# Patient Record
Sex: Male | Born: 1945 | Race: White | Hispanic: No | Marital: Married | Smoking: Current every day smoker
Health system: Southern US, Community
[De-identification: ages and names within clinical notes are randomized; demographics above are authoritative.]

## PROBLEM LIST (undated history)

## (undated) DIAGNOSIS — K259 Gastric ulcer, unspecified as acute or chronic, without hemorrhage or perforation: Secondary | ICD-10-CM

## (undated) DIAGNOSIS — K219 Gastro-esophageal reflux disease without esophagitis: Secondary | ICD-10-CM

## (undated) DIAGNOSIS — Z85828 Personal history of other malignant neoplasm of skin: Secondary | ICD-10-CM

## (undated) DIAGNOSIS — F32A Depression, unspecified: Secondary | ICD-10-CM

## (undated) DIAGNOSIS — H9190 Unspecified hearing loss, unspecified ear: Secondary | ICD-10-CM

## (undated) DIAGNOSIS — C801 Malignant (primary) neoplasm, unspecified: Secondary | ICD-10-CM

## (undated) DIAGNOSIS — F329 Major depressive disorder, single episode, unspecified: Secondary | ICD-10-CM

## (undated) DIAGNOSIS — D649 Anemia, unspecified: Secondary | ICD-10-CM

## (undated) HISTORY — DX: Gastric ulcer, unspecified as acute or chronic, without hemorrhage or perforation: K25.9

## (undated) HISTORY — PX: MANDIBLE SURGERY: SHX707

## (undated) HISTORY — PX: OTHER SURGICAL HISTORY: SHX169

## (undated) HISTORY — PX: PROSTATE BIOPSY: SHX241

## (undated) HISTORY — DX: Personal history of other malignant neoplasm of skin: Z85.828

---

## 1978-02-14 HISTORY — PX: SKIN GRAFT: SHX250

## 2007-05-20 ENCOUNTER — Emergency Department: Payer: Self-pay | Admitting: Emergency Medicine

## 2008-04-01 ENCOUNTER — Emergency Department: Payer: Self-pay | Admitting: Emergency Medicine

## 2012-12-12 DIAGNOSIS — Z85828 Personal history of other malignant neoplasm of skin: Secondary | ICD-10-CM

## 2012-12-12 HISTORY — DX: Personal history of other malignant neoplasm of skin: Z85.828

## 2013-03-15 ENCOUNTER — Ambulatory Visit: Payer: Self-pay | Admitting: Unknown Physician Specialty

## 2013-03-20 LAB — PATHOLOGY REPORT

## 2013-08-06 ENCOUNTER — Emergency Department: Payer: Self-pay | Admitting: Emergency Medicine

## 2013-08-06 LAB — CBC WITH DIFFERENTIAL/PLATELET
BASOS ABS: 0 10*3/uL (ref 0.0–0.1)
Basophil %: 0.1 %
EOS ABS: 0.2 10*3/uL (ref 0.0–0.7)
EOS PCT: 1 %
HCT: 44.8 % (ref 40.0–52.0)
HGB: 15 g/dL (ref 13.0–18.0)
LYMPHS ABS: 2.6 10*3/uL (ref 1.0–3.6)
LYMPHS PCT: 16.4 %
MCH: 34.4 pg — ABNORMAL HIGH (ref 26.0–34.0)
MCHC: 33.6 g/dL (ref 32.0–36.0)
MCV: 102 fL — AB (ref 80–100)
MONOS PCT: 5.7 %
Monocyte #: 0.9 x10 3/mm (ref 0.2–1.0)
NEUTROS ABS: 12.1 10*3/uL — AB (ref 1.4–6.5)
Neutrophil %: 76.8 %
PLATELETS: 193 10*3/uL (ref 150–440)
RBC: 4.38 10*6/uL — ABNORMAL LOW (ref 4.40–5.90)
RDW: 13.7 % (ref 11.5–14.5)
WBC: 15.8 10*3/uL — ABNORMAL HIGH (ref 3.8–10.6)

## 2013-08-06 LAB — COMPREHENSIVE METABOLIC PANEL
ALBUMIN: 3.1 g/dL — AB (ref 3.4–5.0)
ALK PHOS: 64 U/L
ALT: 41 U/L (ref 12–78)
AST: 13 U/L — AB (ref 15–37)
Anion Gap: 7 (ref 7–16)
BUN: 26 mg/dL — ABNORMAL HIGH (ref 7–18)
Bilirubin,Total: 0.3 mg/dL (ref 0.2–1.0)
CO2: 30 mmol/L (ref 21–32)
CREATININE: 0.64 mg/dL (ref 0.60–1.30)
Calcium, Total: 9.1 mg/dL (ref 8.5–10.1)
Chloride: 102 mmol/L (ref 98–107)
EGFR (African American): >60
EGFR (Non-African Amer.): >60
GLUCOSE: 148 mg/dL — AB (ref 65–99)
Osmolality: 285 (ref 275–301)
Potassium: 4.2 mmol/L (ref 3.5–5.1)
Sodium: 139 mmol/L (ref 136–145)
Total Protein: 5.9 g/dL — ABNORMAL LOW (ref 6.4–8.2)

## 2013-08-06 LAB — TROPONIN I: Troponin-I: <0.02 ng/mL

## 2015-02-02 DIAGNOSIS — H259 Unspecified age-related cataract: Secondary | ICD-10-CM | POA: Diagnosis not present

## 2015-02-02 DIAGNOSIS — H9190 Unspecified hearing loss, unspecified ear: Secondary | ICD-10-CM | POA: Diagnosis not present

## 2015-02-02 DIAGNOSIS — Z0000 Encounter for general adult medical examination without abnormal findings: Secondary | ICD-10-CM | POA: Diagnosis not present

## 2015-02-02 DIAGNOSIS — Z6825 Body mass index (BMI) 25.0-25.9, adult: Secondary | ICD-10-CM | POA: Diagnosis not present

## 2015-03-25 DIAGNOSIS — Z0000 Encounter for general adult medical examination without abnormal findings: Secondary | ICD-10-CM | POA: Diagnosis not present

## 2015-03-25 DIAGNOSIS — Z86010 Personal history of colonic polyps: Secondary | ICD-10-CM | POA: Diagnosis not present

## 2015-03-25 DIAGNOSIS — Z125 Encounter for screening for malignant neoplasm of prostate: Secondary | ICD-10-CM | POA: Diagnosis not present

## 2015-03-25 DIAGNOSIS — Z23 Encounter for immunization: Secondary | ICD-10-CM | POA: Diagnosis not present

## 2015-03-25 DIAGNOSIS — D531 Other megaloblastic anemias, not elsewhere classified: Secondary | ICD-10-CM | POA: Diagnosis not present

## 2015-03-25 DIAGNOSIS — H538 Other visual disturbances: Secondary | ICD-10-CM | POA: Diagnosis not present

## 2015-03-25 DIAGNOSIS — M25512 Pain in left shoulder: Secondary | ICD-10-CM | POA: Diagnosis not present

## 2015-03-27 DIAGNOSIS — E538 Deficiency of other specified B group vitamins: Secondary | ICD-10-CM | POA: Diagnosis not present

## 2015-03-31 DIAGNOSIS — H2513 Age-related nuclear cataract, bilateral: Secondary | ICD-10-CM | POA: Diagnosis not present

## 2015-04-03 ENCOUNTER — Encounter: Payer: Self-pay | Admitting: Internal Medicine

## 2015-04-13 ENCOUNTER — Ambulatory Visit (INDEPENDENT_AMBULATORY_CARE_PROVIDER_SITE_OTHER): Payer: Commercial Managed Care - HMO | Admitting: Urology

## 2015-04-13 ENCOUNTER — Encounter: Payer: Self-pay | Admitting: Urology

## 2015-04-13 VITALS — BP 172/77 | HR 65 | Ht 63.75 in | Wt 128.4 lb

## 2015-04-13 DIAGNOSIS — R972 Elevated prostate specific antigen [PSA]: Secondary | ICD-10-CM | POA: Diagnosis not present

## 2015-04-13 NOTE — Progress Notes (Signed)
Consultation: Elevated PSA Requested by: Dr. Candiss Norse  History of present illness: 70 year old white male was recently seen to establish primary care. The PSA was checked and noted to be 7.48. No prior history PSA screening. No family history of prostate cancer. He has some occasional frequency and urgency but he voids with a good stream. No gross hematuria or dysuria. No history of BPH but he does have issues with getting and maintaining an erection.  His past medical history, surgical history, family history, social history, medications and allergies were reviewed. Significant findings noted.  On review of systems, 13 system review of systems was obtained which was negative except for the following: Constipation, blurry vision, double vision, easy bruising, cough, joint pain  Physical exam: Patient was in no acute distress On urologic exam the penis was circumcised and without mass or lesion. The testicles were descended bilaterally and palpably normal. No inguinal hernias were palpated. On digital rectal exam the prostate is small but felt quite indurated with the right side greater than the left side. I didn't note any specific mass or nodule.  Assessment/plan: Elevated PSA-I had a long discussion with the patient and his wife about the nature of elevated PSA and the nature risk and benefits of PSA screening. We discussed the nature risks and benefits of continued surveillance or a prostate biopsy. Although he had no specific mass on the prostate he did feel quite indurated and I recommended a biopsy. We also discussed other lab tests and I sent a repeat PSA with a reflex to free. We discussed prostate cancer management might include surveillance versus treatment depending on patient and cancer characteristics in the treatment usually consist of surgery or some form of radiation. I went over the anatomy with the patient and his wife. All questions answered. Repeat blood work sent and he'll follow-up  for prostate biopsy.

## 2015-04-14 ENCOUNTER — Telehealth: Payer: Self-pay

## 2015-04-14 LAB — PSA, TOTAL AND FREE
PSA, Free Pct: 6.5 %
PSA, Free: 0.51 ng/mL
Prostate Specific Ag, Serum: 7.9 ng/mL — ABNORMAL HIGH (ref 0.0–4.0)

## 2015-04-14 NOTE — Telephone Encounter (Signed)
Spoke with pt in reference to PSA and prostate bx. Pt voiced understanding.  

## 2015-04-14 NOTE — Telephone Encounter (Signed)
-----   Message from Festus Aloe, MD sent at 04/14/2015 10:47 AM EST ----- Notify patient his PSA remains elevated and % free PSA is low (both risks for prostate cancer), therefore keep plan for prostate biopsy.   ----- Message -----    From: Lestine Box, LPN    Sent: [id number]   8:10 AM      To: Festus Aloe, MD    ----- Message -----    From: [facility] Lab Results In Interface    Sent: 04/14/2015   7:43 AM      To: Rowe Robert Clinical

## 2015-04-16 ENCOUNTER — Telehealth: Payer: Self-pay

## 2015-04-16 NOTE — Telephone Encounter (Signed)
Spoke with pt in reference to PSA and prostate bx. Pt voiced understanding.  

## 2015-04-16 NOTE — Telephone Encounter (Signed)
-----   Message from Festus Aloe, MD sent at 04/15/2015  3:31 PM EST ----- PSA remains elevated. Notify patient. Keep plan for prostate biopsy.   ----- Message -----    From: Lestine Box, LPN    Sent: [id number]   1:20 PM      To: Festus Aloe, MD    ----- Message -----    From: [facility] Lab Results In Interface    Sent: 04/14/2015   7:43 AM      To: Rowe Robert Clinical

## 2015-05-18 ENCOUNTER — Ambulatory Visit (INDEPENDENT_AMBULATORY_CARE_PROVIDER_SITE_OTHER): Payer: Commercial Managed Care - HMO | Admitting: Urology

## 2015-05-18 ENCOUNTER — Other Ambulatory Visit: Payer: Self-pay | Admitting: Urology

## 2015-05-18 ENCOUNTER — Encounter: Payer: Self-pay | Admitting: Urology

## 2015-05-18 VITALS — BP 117/71 | HR 80 | Ht 63.75 in | Wt 127.9 lb

## 2015-05-18 DIAGNOSIS — R972 Elevated prostate specific antigen [PSA]: Secondary | ICD-10-CM

## 2015-05-18 DIAGNOSIS — C61 Malignant neoplasm of prostate: Secondary | ICD-10-CM | POA: Diagnosis not present

## 2015-05-18 MED ORDER — LIDOCAINE HCL 2 % EX GEL
1.0000 "application " | Freq: Once | CUTANEOUS | Status: AC
  Administered 2015-05-18: 1 via URETHRAL

## 2015-05-18 MED ORDER — GENTAMICIN SULFATE 40 MG/ML IJ SOLN
80.0000 mg | Freq: Once | INTRAMUSCULAR | Status: AC
  Administered 2015-05-18: 80 mg via INTRAMUSCULAR

## 2015-05-18 MED ORDER — LEVOFLOXACIN 500 MG PO TABS
500.0000 mg | ORAL_TABLET | Freq: Once | ORAL | Status: AC
  Administered 2015-05-18: 500 mg via ORAL

## 2015-05-18 NOTE — Progress Notes (Signed)
F/u - Recent PSA noted to be 7.48. Right side prostate indurated. No family history of prostate cancer. He has some occasional frequency and urgency but he voids with a good stream. No history of BPH but he does have issues with getting and maintaining an erection.  His PSA was repeated 04/13/2015 and was 7.9 with a 6.5% free. He presents today for prostate biopsy and his been well without dysuria or gross hematuria.  Procedure: Prostate ultrasound-patient placed in left lateral decubitus position. Digital rectal exam was performed. Ultrasound probe was introduced per rectum and transrectal ultrasound of the prostate obtained. Ultrasound needle guidance was then used to obtain standard 12 core biopsy, base, mid, apex, lateral to medial, left and right.  Findings: On digital rectal exam the prostate at some subtle induration of the right side but all landmarks preserved. No specific nodules. Ultrasound of the prostate was normal, the prostate measured 4.09 cm in length, 2.24 cm in width, 4.4 cm in height for a volume of 21.44 g.  Assessment/plan- T1c/T2 1, prostate 21 g-tolerated biopsy well. Will notify of result and have him return for discussion.

## 2015-05-19 DIAGNOSIS — F17200 Nicotine dependence, unspecified, uncomplicated: Secondary | ICD-10-CM | POA: Insufficient documentation

## 2015-05-19 DIAGNOSIS — K2990 Gastroduodenitis, unspecified, without bleeding: Secondary | ICD-10-CM | POA: Diagnosis not present

## 2015-05-19 DIAGNOSIS — M25519 Pain in unspecified shoulder: Secondary | ICD-10-CM | POA: Insufficient documentation

## 2015-05-19 DIAGNOSIS — G8929 Other chronic pain: Secondary | ICD-10-CM | POA: Diagnosis not present

## 2015-05-19 DIAGNOSIS — E538 Deficiency of other specified B group vitamins: Secondary | ICD-10-CM | POA: Insufficient documentation

## 2015-05-19 DIAGNOSIS — K2970 Gastritis, unspecified, without bleeding: Secondary | ICD-10-CM | POA: Diagnosis not present

## 2015-05-19 DIAGNOSIS — M25512 Pain in left shoulder: Secondary | ICD-10-CM | POA: Diagnosis not present

## 2015-05-23 LAB — PATHOLOGY REPORT

## 2015-06-01 ENCOUNTER — Ambulatory Visit: Payer: Commercial Managed Care - HMO

## 2015-06-04 ENCOUNTER — Other Ambulatory Visit: Payer: Self-pay | Admitting: Urology

## 2015-06-05 ENCOUNTER — Encounter: Payer: Self-pay | Admitting: *Deleted

## 2015-06-05 ENCOUNTER — Ambulatory Visit (INDEPENDENT_AMBULATORY_CARE_PROVIDER_SITE_OTHER): Payer: Commercial Managed Care - HMO | Admitting: Urology

## 2015-06-05 DIAGNOSIS — C61 Malignant neoplasm of prostate: Secondary | ICD-10-CM

## 2015-06-05 NOTE — Progress Notes (Signed)
06/05/2015 12:22 PM   Rinaldo Cloud 1945-11-28 [id number]  Referring provider: Glendon Axe, MD [place] [facility] [place], [place] [address]  Chief Complaint  Patient presents with  . Follow-up    biopsy results    HPI: 70 year old white male was recently seen to establish primary care. The PSA was checked and noted to be 7.48. No prior history PSA screening. No family history of prostate cancer. He has some occasional frequency and urgency but he voids with a good stream. No gross hematuria or dysuria. No history of BPH but he does have issues with getting and maintaining an erection.  The patient underwent a prostate biopsy which was positive for of 12 cores for Gleason 3+3 = 6 prostate cancer. Volumes of the cores ranged from 27% to 85%.    PMH: Past Medical History  Diagnosis Date  . H/O malignant neoplasm of skin 12/12/2012  . Stomach ulcer     Surgical History: Past Surgical History  Procedure Laterality Date  . Skin cancer removal    . Skin graft  1980    Home Medications:    Medication List       This list is accurate as of: 06/05/15 12:22 PM.  Always use your most recent med list.               cyanocobalamin 1000 MCG/ML injection  Commonly known as:  (VITAMIN B-12)  Inject into the muscle.     pantoprazole 40 MG tablet  Commonly known as:  PROTONIX  Take by mouth. Reported on 06/05/2015     polyethylene glycol powder powder  Commonly known as:  GLYCOLAX/MIRALAX  Reported on 05/18/2015     polyethylene glycol powder powder  Commonly known as:  GLYCOLAX/MIRALAX  Reported on 06/05/2015     RA ACETAMINOPHEN 650 MG CR tablet  Generic drug:  acetaminophen  Take by mouth.     traMADol 50 MG tablet  Commonly known as:  ULTRAM        Allergies: No Known Allergies  Family History: No family history on file.  Social History:  reports that he has been smoking Cigarettes.  He has been smoking about 2.00 packs per day. He does  not have any smokeless tobacco history on file. He reports that he drinks alcohol. He reports that he does not use illicit drugs.  ROS:                                        Physical Exam: There were no vitals taken for this visit.  Constitutional:  Alert and oriented, No acute distress. HEENT: [place] AT, moist mucus membranes.  Trachea midline, no masses. Cardiovascular: No clubbing, cyanosis, or edema. Respiratory: Normal respiratory effort, no increased work of breathing. GI: Abdomen is soft, nontender, nondistended, no abdominal masses GU: No CVA tenderness.  Skin: No rashes, bruises or suspicious lesions. Lymph: No cervical or inguinal adenopathy. Neurologic: Grossly intact, no focal deficits, moving all 4 extremities. Psychiatric: Normal mood and affect.  Laboratory Data: Lab Results  Component Value Date   WBC 15.8* 08/06/2013   HGB 15.0 08/06/2013   HCT 44.8 08/06/2013   MCV 102* 08/06/2013   PLT 193 08/06/2013    Lab Results  Component Value Date   CREATININE 0.64 08/06/2013    No results found for: PSA  No results found for: TESTOSTERONE  No results  found for: HGBA1C  Urinalysis No results found for: COLORURINE, APPEARANCEUR, LABSPEC, [place], GLUCOSEU, HGBUR, BILIRUBINUR, KETONESUR, PROTEINUR, UROBILINOGEN, NITRITE, LEUKOCYTESUR   Assessment & Plan:   I had a long discussion with the patient regarding his new diagnosis of low risk prostate cancer. We discussed the natural course of prostate cancer. We also discussed Gleason score grades and how he falls into the low risk category. We discussed treatment modalities which include watchful waiting, active surveillance, robotic prostatectomy, radiation therapy, and cryotherapy. In particular we discussed active surveillance in great detail. He understands the goal of active surveillance is to monitor his prostate cancer and if and when he progresses to still treat the prostate cancer with a  goal for cure. He understands the risks and benefits of this treatment approach. He does understand the risks including but limited to missing the curative window for his prostate cancer as well as missing an opportunity for nursery. He understands the benefit is not to suffer the side effects of prostate cancer treatment for the treatment of a cancer currently is not life-threatening. He understands he will need close follow-up with a repeat prostate biopsy in 3 months. He also understands. DRE and PSA every 6 months after that with more repeat biopsies down the road. He would like to pursue active surveillance. He would like to wait until 4 months ago so he can do this summer at his [place].   1. Low risk prostate cancer -repeat prostate biopsy in 4 months  No Follow-up on file.  Nickie Retort, MD  [facility] [address], [place], [place] [address] [phone]

## 2015-06-08 ENCOUNTER — Ambulatory Visit: Payer: Commercial Managed Care - HMO | Admitting: Anesthesiology

## 2015-06-08 ENCOUNTER — Encounter: Payer: Self-pay | Admitting: *Deleted

## 2015-06-08 ENCOUNTER — Ambulatory Visit
Admission: RE | Admit: 2015-06-08 | Discharge: 2015-06-08 | Disposition: A | Discharge status: Home / Self Care | Payer: Commercial Managed Care - HMO | Source: Ambulatory Visit | Attending: Unknown Physician Specialty | Admitting: Unknown Physician Specialty

## 2015-06-08 ENCOUNTER — Encounter
Admission: RE | Disposition: A | Discharge status: Home / Self Care | Payer: Self-pay | Source: Ambulatory Visit | Attending: Unknown Physician Specialty

## 2015-06-08 DIAGNOSIS — D127 Benign neoplasm of rectosigmoid junction: Secondary | ICD-10-CM | POA: Insufficient documentation

## 2015-06-08 DIAGNOSIS — K648 Other hemorrhoids: Secondary | ICD-10-CM | POA: Diagnosis not present

## 2015-06-08 DIAGNOSIS — R972 Elevated prostate specific antigen [PSA]: Secondary | ICD-10-CM | POA: Diagnosis not present

## 2015-06-08 DIAGNOSIS — Z86010 Personal history of colonic polyps: Secondary | ICD-10-CM | POA: Diagnosis not present

## 2015-06-08 DIAGNOSIS — D123 Benign neoplasm of transverse colon: Secondary | ICD-10-CM | POA: Diagnosis not present

## 2015-06-08 DIAGNOSIS — K640 First degree hemorrhoids: Secondary | ICD-10-CM | POA: Diagnosis not present

## 2015-06-08 DIAGNOSIS — F17210 Nicotine dependence, cigarettes, uncomplicated: Secondary | ICD-10-CM | POA: Insufficient documentation

## 2015-06-08 DIAGNOSIS — Z85828 Personal history of other malignant neoplasm of skin: Secondary | ICD-10-CM | POA: Insufficient documentation

## 2015-06-08 DIAGNOSIS — K635 Polyp of colon: Secondary | ICD-10-CM | POA: Diagnosis not present

## 2015-06-08 DIAGNOSIS — D124 Benign neoplasm of descending colon: Secondary | ICD-10-CM | POA: Insufficient documentation

## 2015-06-08 DIAGNOSIS — Z79899 Other long term (current) drug therapy: Secondary | ICD-10-CM | POA: Diagnosis not present

## 2015-06-08 DIAGNOSIS — D122 Benign neoplasm of ascending colon: Secondary | ICD-10-CM | POA: Insufficient documentation

## 2015-06-08 DIAGNOSIS — K279 Peptic ulcer, site unspecified, unspecified as acute or chronic, without hemorrhage or perforation: Secondary | ICD-10-CM | POA: Diagnosis not present

## 2015-06-08 DIAGNOSIS — Z1211 Encounter for screening for malignant neoplasm of colon: Secondary | ICD-10-CM | POA: Diagnosis not present

## 2015-06-08 HISTORY — PX: COLONOSCOPY WITH PROPOFOL: SHX5780

## 2015-06-08 HISTORY — DX: Malignant (primary) neoplasm, unspecified: C80.1

## 2015-06-08 SURGERY — COLONOSCOPY WITH PROPOFOL
Anesthesia: General

## 2015-06-08 MED ORDER — SODIUM CHLORIDE 0.9 % IJ SOLN: INTRAMUSCULAR | Status: DC | PRN

## 2015-06-08 MED ORDER — FENTANYL CITRATE (PF) 100 MCG/2ML IJ SOLN
INTRAMUSCULAR | Status: DC | PRN
Start: 2015-06-08 — End: 2015-06-08
  Administered 2015-06-08: 50 ug via INTRAVENOUS

## 2015-06-08 MED ORDER — EPHEDRINE SULFATE 50 MG/ML IJ SOLN
INTRAMUSCULAR | Status: DC | PRN
  Administered 2015-06-08: 10 mg via INTRAVENOUS
  Administered 2015-06-08: 5 mg via INTRAVENOUS
  Administered 2015-06-08: 10 mg via INTRAVENOUS

## 2015-06-08 MED ORDER — SODIUM CHLORIDE 0.9 % IV SOLN
INTRAVENOUS | Status: DC
  Administered 2015-06-08 (×2): via INTRAVENOUS

## 2015-06-08 MED ORDER — MIDAZOLAM HCL 2 MG/2ML IJ SOLN
INTRAMUSCULAR | Status: DC | PRN
  Administered 2015-06-08: 1 mg via INTRAVENOUS

## 2015-06-08 MED ORDER — PROPOFOL 500 MG/50ML IV EMUL
INTRAVENOUS | Status: DC | PRN
  Administered 2015-06-08: 120 ug/kg/min via INTRAVENOUS

## 2015-06-08 NOTE — Op Note (Signed)
[facility] Gastroenterology Patient Name: Eugene Bennett Procedure Date: 06/08/2015 4:08 PM MRN: [id number] Account #: [id number] Date of Birth: Nov 24, 1945 Admit Type: Outpatient Age: 70 Room: [facility] ENDO ROOM 1 Gender: Male Note Status: Finalized Procedure:            Colonoscopy Indications:          High risk colon cancer surveillance: Personal history                        of colonic polyps Providers:            Manya Silvas, MD Referring MD:         Glendon Axe (Referring MD) Medicines:            Propofol per Anesthesia Complications:        No immediate complications. Procedure:            Pre-Anesthesia Assessment:                       - After reviewing the risks and benefits, the patient                        was deemed in satisfactory condition to undergo the                        procedure.                       After obtaining informed consent, the colonoscope was                        passed under direct vision. Throughout the procedure,                        the patient's blood pressure, pulse, and oxygen                        saturations were monitored continuously. The                        Colonoscope was introduced through the anus and                        advanced to the the cecum, identified by appendiceal                        orifice and ileocecal valve. The colonoscopy was                        somewhat difficult. The patient tolerated the procedure                        well. The quality of the bowel preparation was adequate                        to identify polyps. Findings:      Four sessile polyps were found in the ascending colon. The polyps were       small in size. These polyps were removed with a hot snare. Resection and       retrieval were complete.      A 19 mm  polyp was found in the ascending colon. The polyp was sessile.       The polyp was removed with a hot snare. Resection and retrieval were       complete.  To prevent bleeding after the polypectomy, four hemostatic       clips were successfully placed. There was no bleeding during, or at the       end, of the procedure.      Two sessile polyps were found in the descending colon. The polyps were       small in size. These polyps were removed with a hot snare. Resection and       retrieval were complete.      Two sessile polyps were found in the recto-sigmoid colon. The polyps       were small in size. These polyps were removed with a hot snare.       Resection and retrieval were complete. To prevent bleeding after the       polypectomy, one hemostatic clip was successfully placed. There was no       bleeding during, or at the end, of the procedure.      Internal hemorrhoids were found during endoscopy. The hemorrhoids were       small and Grade I (internal hemorrhoids that do not prolapse). Impression:           - Four small polyps in the ascending colon, removed                        with a hot snare. Resected and retrieved.                       - One 19 mm polyp in the ascending colon, removed with                        a hot snare. Resected and retrieved. Clips were placed.                       - Two small polyps in the descending colon, removed                        with a hot snare. Resected and retrieved.                       - Two small polyps at the recto-sigmoid colon, removed                        with a hot snare. Resected and retrieved. Clip was                        placed.                       - Internal hemorrhoids. Recommendation:       - Await pathology results. DO NOT TAKE ANY ADVIL,                        ALEVE, IBUPROFEN OR FULL STRENGTH ASPRIN. Manya Silvas, MD 06/08/2015 5:25:54 PM This report has been signed electronically. Number of Addenda: 0 Note Initiated On: 06/08/2015 4:08 PM Scope Withdrawal Time: 0 hours 54 minutes 47 seconds  Total Procedure Duration: 1 hour  5 minutes 57 seconds       [facility]

## 2015-06-08 NOTE — Transfer of Care (Signed)
Immediate Anesthesia Transfer of Care Note  Patient: Eugene Bennett  Procedure(s) Performed: Procedure(s): COLONOSCOPY WITH PROPOFOL (N/A)  Patient Location: PACU  Anesthesia Type:General  Level of Consciousness: awake, alert , oriented and sedated  Airway & Oxygen Therapy: Patient Spontanous Breathing and Patient connected to nasal cannula oxygen  Post-op Assessment: Report given to RN and Post -op Vital signs reviewed and stable  Post vital signs: Reviewed and stable  Last Vitals:  Filed Vitals:   06/08/15 1459 06/08/15 1720  BP: 132/61 122/54  Pulse: 62 86  Temp: 36.6 C 36 C  Resp: 18 15    Complications: No apparent anesthesia complications

## 2015-06-08 NOTE — Anesthesia Preprocedure Evaluation (Signed)
Anesthesia Evaluation  Patient identified by MRN, date of birth, ID band Patient awake    Reviewed: Allergy & Precautions, NPO status , Patient's Chart, lab work & pertinent test results, reviewed documented beta blocker date and time   Airway Mallampati: II  TM Distance: >3 FB     Dental  (+) Chipped   Pulmonary Current Smoker,           Cardiovascular      Neuro/Psych    GI/Hepatic PUD,   Endo/Other    Renal/GU      Musculoskeletal   Abdominal   Peds  Hematology   Anesthesia Other Findings   Reproductive/Obstetrics                             Anesthesia Physical Anesthesia Plan  ASA: III  Anesthesia Plan: General   Post-op Pain Management:    Induction: Intravenous  Airway Management Planned: Nasal Cannula  Additional Equipment:   Intra-op Plan:   Post-operative Plan:   Informed Consent: I have reviewed the patients History and Physical, chart, labs and discussed the procedure including the risks, benefits and alternatives for the proposed anesthesia with the patient or authorized representative who has indicated his/her understanding and acceptance.     Plan Discussed with: CRNA  Anesthesia Plan Comments:         Anesthesia Quick Evaluation

## 2015-06-08 NOTE — H&P (Signed)
   Primary Care Physician:  Glendon Axe, MD Primary Gastroenterologist:  Dr. Vira Agar  Pre-Procedure History & Physical: HPI:  Eugene Bennett is a 70 y.o. male is here for an colonoscopy.   Past Medical History  Diagnosis Date  . H/O malignant neoplasm of skin 12/12/2012  . Stomach ulcer   . Cancer [facility])     Past Surgical History  Procedure Laterality Date  . Skin cancer removal    . Skin graft  1980    Prior to Admission medications   Medication Sig Start Date End Date Taking? Authorizing Provider  cyanocobalamin (,VITAMIN B-12,) 1000 MCG/ML injection Inject into the muscle. 03/27/15  Yes Historical Provider, MD  pantoprazole (PROTONIX) 40 MG tablet Take by mouth. Reported on 06/05/2015 05/19/15 05/18/16 Yes Historical Provider, MD  polyethylene glycol powder (GLYCOLAX/MIRALAX) powder Reported on 05/18/2015 04/14/15  Yes Historical Provider, MD  polyethylene glycol powder (GLYCOLAX/MIRALAX) powder Reported on 06/05/2015 04/21/15  Yes Historical Provider, MD  traMADol Veatrice Bourbon) 50 MG tablet  05/19/15  Yes Historical Provider, MD  acetaminophen (RA ACETAMINOPHEN) 650 MG CR tablet Take by mouth. Reported on 06/08/2015    Historical Provider, MD    Allergies as of 06/02/2015  . (No Known Allergies)    History reviewed. No pertinent family history.  Social History   Social History  . Marital Status: Married    Spouse Name: N/A  . Number of Children: N/A  . Years of Education: N/A   Occupational History  . Not on file.   Social History Main Topics  . Smoking status: Current Some Day Smoker -- 2.00 packs/day    Types: Cigarettes  . Smokeless tobacco: Not on file  . Alcohol Use: 0.0 oz/week    0 Standard drinks or equivalent per week  . Drug Use: No  . Sexual Activity: Not on file   Other Topics Concern  . Not on file   Social History Narrative    Review of Systems: See HPI, otherwise negative ROS  Physical Exam: BP 132/61 mmHg  Pulse 62  Temp(Src) 97.8 F (36.6 C)  (Oral)  Resp 18  Ht 5\' 3"  (1.6 m)  Wt 58.514 kg (129 lb)  BMI 22.86 kg/m2  SpO2 100% General:   Alert,  pleasant and cooperative in NAD Head:  Normocephalic and atraumatic. Neck:  Supple; no masses or thyromegaly. Lungs:  Clear throughout to auscultation.    Heart:  Regular rate and rhythm. Abdomen:  Soft, nontender and nondistended. Normal bowel sounds, without guarding, and without rebound.   Neurologic:  Alert and  oriented x4;  grossly normal neurologically.  Impression/Plan: KHALYL CASABLANCA is here for an colonoscopy to be performed for [facility] colon polyps  Risks, benefits, limitations, and alternatives regarding  colonoscopy have been reviewed with the patient.  Questions have been answered.  All parties agreeable.   Gaylyn Cheers, MD  06/08/2015, 4:05 PM

## 2015-06-08 NOTE — Anesthesia Procedure Notes (Signed)
Performed by: COOK-MARTIN, Saraya Tirey Pre-anesthesia Checklist: Patient identified, Emergency Drugs available, Suction available, Patient being monitored and Timeout performed Patient Re-evaluated:Patient Re-evaluated prior to inductionOxygen Delivery Method: Simple face mask Preoxygenation: Pre-oxygenation with 100% oxygen Intubation Type: IV induction Placement Confirmation: positive ETCO2 and CO2 detector       

## 2015-06-08 NOTE — Anesthesia Postprocedure Evaluation (Signed)
Anesthesia Post Note  Patient: Eugene Bennett  Procedure(s) Performed: Procedure(s) (LRB): COLONOSCOPY WITH PROPOFOL (N/A)  Patient location during evaluation: Endoscopy Anesthesia Type: General Level of consciousness: awake and alert Pain management: pain level controlled Vital Signs Assessment: post-procedure vital signs reviewed and stable Respiratory status: spontaneous breathing, nonlabored ventilation, respiratory function stable and patient connected to nasal cannula oxygen Cardiovascular status: blood pressure returned to baseline and stable Postop Assessment: no signs of nausea or vomiting Anesthetic complications: no    Last Vitals:  Filed Vitals:   06/08/15 1742 06/08/15 1750  BP: 120/81 135/67  Pulse: 72 72  Temp:    Resp: 19 21    Last Pain: There were no vitals filed for this visit.               [name],Arlinda Barcelona S

## 2015-06-10 ENCOUNTER — Encounter: Payer: Self-pay | Admitting: Unknown Physician Specialty

## 2015-06-10 LAB — SURGICAL PATHOLOGY

## 2015-08-27 DIAGNOSIS — G8929 Other chronic pain: Secondary | ICD-10-CM | POA: Diagnosis not present

## 2015-08-27 DIAGNOSIS — C61 Malignant neoplasm of prostate: Secondary | ICD-10-CM | POA: Diagnosis not present

## 2015-08-27 DIAGNOSIS — K2970 Gastritis, unspecified, without bleeding: Secondary | ICD-10-CM | POA: Diagnosis not present

## 2015-08-27 DIAGNOSIS — F17200 Nicotine dependence, unspecified, uncomplicated: Secondary | ICD-10-CM | POA: Diagnosis not present

## 2015-08-27 DIAGNOSIS — K2990 Gastroduodenitis, unspecified, without bleeding: Secondary | ICD-10-CM | POA: Diagnosis not present

## 2015-08-27 DIAGNOSIS — M25512 Pain in left shoulder: Secondary | ICD-10-CM | POA: Diagnosis not present

## 2015-08-27 DIAGNOSIS — E538 Deficiency of other specified B group vitamins: Secondary | ICD-10-CM | POA: Diagnosis not present

## 2015-10-22 ENCOUNTER — Ambulatory Visit (INDEPENDENT_AMBULATORY_CARE_PROVIDER_SITE_OTHER): Payer: Commercial Managed Care - HMO | Admitting: Urology

## 2015-10-22 ENCOUNTER — Encounter: Payer: Self-pay | Admitting: Urology

## 2015-10-22 ENCOUNTER — Other Ambulatory Visit: Payer: Self-pay | Admitting: Urology

## 2015-10-22 VITALS — BP 124/75 | HR 76 | Ht 60.25 in | Wt 127.2 lb

## 2015-10-22 DIAGNOSIS — C61 Malignant neoplasm of prostate: Secondary | ICD-10-CM | POA: Diagnosis not present

## 2015-10-22 MED ORDER — LEVOFLOXACIN 500 MG PO TABS
500.0000 mg | ORAL_TABLET | Freq: Once | ORAL | Status: AC
  Administered 2015-10-22: 500 mg via ORAL

## 2015-10-22 MED ORDER — GENTAMICIN SULFATE 40 MG/ML IJ SOLN
80.0000 mg | Freq: Once | INTRAMUSCULAR | Status: AC
  Administered 2015-10-22: 80 mg via INTRAMUSCULAR

## 2015-10-22 NOTE — Progress Notes (Signed)
Prostate Biopsy Procedure   HPI: On active surveillance for low risk prostate cancer.  Informed consent was obtained after discussing risks/benefits of the procedure.  A time out was performed to ensure correct patient identity.  Pre-Procedure: - Last PSA Level: 7.48 - Gentamicin given prophylactically - Levaquin 500 mg administered PO -Transrectal Ultrasound performed revealing a 19.5 gm prostate -No significant hypoechoic or median lobe noted  Procedure: - Prostate block performed using 10 cc 1% lidocaine and biopsies taken from sextant areas, a total of 12 under ultrasound guidance.  Post-Procedure: - Patient tolerated the procedure well - He was counseled to seek immediate medical attention if experiences any severe pain, significant bleeding, or fevers - Return in one week to discuss biopsy results

## 2015-10-27 ENCOUNTER — Emergency Department
Admission: EM | Admit: 2015-10-27 | Discharge: 2015-10-27 | Disposition: A | Discharge status: Home / Self Care | Payer: Commercial Managed Care - HMO | Attending: Emergency Medicine | Admitting: Emergency Medicine

## 2015-10-27 ENCOUNTER — Emergency Department: Payer: Commercial Managed Care - HMO

## 2015-10-27 DIAGNOSIS — Z79899 Other long term (current) drug therapy: Secondary | ICD-10-CM | POA: Diagnosis not present

## 2015-10-27 DIAGNOSIS — N390 Urinary tract infection, site not specified: Secondary | ICD-10-CM | POA: Diagnosis not present

## 2015-10-27 DIAGNOSIS — R062 Wheezing: Secondary | ICD-10-CM | POA: Insufficient documentation

## 2015-10-27 DIAGNOSIS — F17210 Nicotine dependence, cigarettes, uncomplicated: Secondary | ICD-10-CM | POA: Insufficient documentation

## 2015-10-27 DIAGNOSIS — Z791 Long term (current) use of non-steroidal anti-inflammatories (NSAID): Secondary | ICD-10-CM | POA: Insufficient documentation

## 2015-10-27 DIAGNOSIS — R509 Fever, unspecified: Secondary | ICD-10-CM | POA: Diagnosis not present

## 2015-10-27 DIAGNOSIS — Z85828 Personal history of other malignant neoplasm of skin: Secondary | ICD-10-CM | POA: Insufficient documentation

## 2015-10-27 LAB — URINE DRUG SCREEN, QUALITATIVE (ARMC ONLY)
AMPHETAMINES, UR SCREEN: NOT DETECTED
Barbiturates, Ur Screen: NOT DETECTED
Benzodiazepine, Ur Scrn: NOT DETECTED
COCAINE METABOLITE, UR ~~LOC~~: NOT DETECTED
Cannabinoid 50 Ng, Ur ~~LOC~~: NOT DETECTED
MDMA (Ecstasy)Ur Screen: NOT DETECTED
METHADONE SCREEN, URINE: NOT DETECTED
OPIATE, UR SCREEN: POSITIVE — AB
Phencyclidine (PCP) Ur S: NOT DETECTED
Tricyclic, Ur Screen: NOT DETECTED

## 2015-10-27 LAB — URINALYSIS COMPLETE WITH MICROSCOPIC (ARMC ONLY)
BILIRUBIN URINE: NEGATIVE
GLUCOSE, UA: NEGATIVE mg/dL
KETONES UR: NEGATIVE mg/dL
NITRITE: NEGATIVE
Protein, ur: 30 mg/dL — AB
Specific Gravity, Urine: 1.032 — ABNORMAL HIGH (ref 1.005–1.030)
pH: 5 (ref 5.0–8.0)

## 2015-10-27 LAB — CBC WITH DIFFERENTIAL/PLATELET
Basophils Absolute: 0 10*3/uL (ref 0–0.1)
Basophils Relative: 1 %
EOS ABS: 0.1 10*3/uL (ref 0–0.7)
EOS PCT: 3 %
HCT: 43.1 % (ref 40.0–52.0)
Hemoglobin: 15.1 g/dL (ref 13.0–18.0)
LYMPHS ABS: 0.6 10*3/uL — AB (ref 1.0–3.6)
Lymphocytes Relative: 13 %
MCH: 34.6 pg — AB (ref 26.0–34.0)
MCHC: 35.1 g/dL (ref 32.0–36.0)
MCV: 98.4 fL (ref 80.0–100.0)
MONO ABS: 0.3 10*3/uL (ref 0.2–1.0)
MONOS PCT: 6 %
Neutro Abs: 3.3 10*3/uL (ref 1.4–6.5)
Neutrophils Relative %: 77 %
PLATELETS: 136 10*3/uL — AB (ref 150–440)
RBC: 4.38 MIL/uL — ABNORMAL LOW (ref 4.40–5.90)
RDW: 13.1 % (ref 11.5–14.5)
WBC: 4.4 10*3/uL (ref 3.8–10.6)

## 2015-10-27 LAB — COMPREHENSIVE METABOLIC PANEL
ALK PHOS: 79 U/L (ref 38–126)
ALT: 33 U/L (ref 17–63)
ANION GAP: 7 (ref 5–15)
AST: 30 U/L (ref 15–41)
Albumin: 3.4 g/dL — ABNORMAL LOW (ref 3.5–5.0)
BUN: 16 mg/dL (ref 6–20)
CALCIUM: 8.6 mg/dL — AB (ref 8.9–10.3)
CHLORIDE: 105 mmol/L (ref 101–111)
CO2: 23 mmol/L (ref 22–32)
Creatinine, Ser: 1.04 mg/dL (ref 0.61–1.24)
GFR calc non Af Amer: >60 mL/min (ref >60)
Glucose, Bld: 149 mg/dL — ABNORMAL HIGH (ref 65–99)
Potassium: 3.8 mmol/L (ref 3.5–5.1)
SODIUM: 135 mmol/L (ref 135–145)
Total Bilirubin: 0.4 mg/dL (ref 0.3–1.2)
Total Protein: 6.8 g/dL (ref 6.5–8.1)

## 2015-10-27 LAB — TROPONIN I: Troponin I: <0.03 ng/mL (ref <0.03)

## 2015-10-27 LAB — TSH: TSH: 2.785 u[IU]/mL (ref 0.350–4.500)

## 2015-10-27 LAB — BRAIN NATRIURETIC PEPTIDE: B NATRIURETIC PEPTIDE 5: 22 pg/mL (ref 0.0–100.0)

## 2015-10-27 MED ORDER — CEPHALEXIN 500 MG PO CAPS: 500.0000 mg | ORAL_CAPSULE | Freq: Four times a day (QID) | ORAL | 0 refills | Status: AC

## 2015-10-27 MED ORDER — IOPAMIDOL (ISOVUE-300) INJECTION 61%
100.0000 mL | Freq: Once | INTRAVENOUS | Status: AC | PRN
  Administered 2015-10-27: 100 mL via INTRAVENOUS

## 2015-10-27 MED ORDER — IOPAMIDOL (ISOVUE-300) INJECTION 61%
30.0000 mL | Freq: Once | INTRAVENOUS | Status: AC | PRN
  Administered 2015-10-27: 30 mL via ORAL

## 2015-10-27 MED ORDER — DEXTROSE 5 % IV SOLN
1.0000 g | Freq: Once | INTRAVENOUS | Status: AC
  Administered 2015-10-27: 1 g via INTRAVENOUS
  Filled 2015-10-27: qty 10

## 2015-10-27 NOTE — Discharge Instructions (Signed)
Please take the Keflex antibiotic one pill 4 times a day. Please return if you're feeling worse at all. Please follow-up with your doctor in the next 2 days.

## 2015-10-27 NOTE — ED Notes (Signed)
Pt comes in to ED w/ c/o Fever, Chills, Sweating, Fatigue x4 days. Prostate Biopsy was done Thursday as pt has hx of prostate cancer that was being monitored. Pts wife states mental status seems normal, just "slow". Pt denies pain, cugh, and congestion. NAD noted at this time, family at bedside.

## 2015-10-27 NOTE — ED Provider Notes (Signed)
[facility] Emergency Department Provider Note   ____________________________________________   First MD Initiated Contact with Patient 10/27/15 1551     (approximate)  I have reviewed the triage vital signs and the nursing notes.   HISTORY  Chief Complaint Altered Mental Status    HPI Eugene Bennett is a 70 y.o. male patient wife reported his prostate was biopsy last Thursday. Since then he's been feeling slow and perhaps not himself. He's been running a fever up to 102 at home last couple days she's been taking Tylenol also hasn't seemed to run a fever but he still sweating at night especially. He has not had a fever here. He reports nothing is hurting him. He does have a kind of a dry cough.   Past Medical History:  Diagnosis Date  . Cancer ([place])   . H/O malignant neoplasm of skin 12/12/2012  . Stomach ulcer     Patient Active Problem List   Diagnosis Date Noted  . Pain in shoulder 05/19/2015  . Gastric catarrh 05/19/2015  . Current smoker 05/19/2015  . B12 deficiency 05/19/2015  . Elevated PSA 04/13/2015  . H/O malignant neoplasm of skin 12/12/2012    Past Surgical History:  Procedure Laterality Date  . COLONOSCOPY WITH PROPOFOL N/A 06/08/2015   Procedure: COLONOSCOPY WITH PROPOFOL;  Surgeon: Manya Silvas, MD;  Location: [facility] ENDOSCOPY;  Service: Endoscopy;  Laterality: N/A;  . skin cancer removal    . SKIN GRAFT  1980    Prior to Admission medications   Medication Sig Start Date End Date Taking? Authorizing Provider  acetaminophen (TYLENOL) 500 MG tablet Take 1,000 mg by mouth every 6 (six) hours as needed.   Yes Historical Provider, MD  traMADol (ULTRAM) 50 MG tablet TAKE 1 TO 2 TABLETS BY MOUTH THREE TIMES DAILY AS NEEDED FOR PAIN 10/12/15  Yes Historical Provider, MD  cyanocobalamin (,VITAMIN B-12,) 1000 MCG/ML injection Inject 1,000 mcg into the muscle every 30 (thirty) days.  03/27/15   Historical Provider, MD  pantoprazole  (PROTONIX) 40 MG tablet Take 40 mg by mouth daily. Reported on 06/05/2015 05/19/15 05/18/16  Historical Provider, MD    Allergies Review of patient's allergies indicates no known allergies.  No family history on file.  Social History Social History  Substance Use Topics  . Smoking status: Current Some Day Smoker    Packs/day: 2.00    Types: Cigarettes  . Smokeless tobacco: Never Used  . Alcohol use 0.0 oz/week    Review of Systems Constitutional:fever/chills Eyes: No visual changes. ENT: No sore throat. Cardiovascular: Denies chest pain. Respiratory: Denies shortness of breath. Gastrointestinal: No abdominal pain.  No nausea, no vomiting.  No diarrhea.  No constipation. Genitourinary: Negative for dysuria. Musculoskeletal: Negative for back pain. Skin: Negative for rash.  10-point ROS otherwise negative.  ____________________________________________   PHYSICAL EXAM:  VITAL SIGNS: ED Triage Vitals [10/27/15 1520]  Enc Vitals Group     BP 126/70     Pulse Rate 89     Resp 16     Temp 97.9 F (36.6 C)     Temp Source Oral     SpO2 97 %     Weight 127 lb (57.6 kg)     Height 5' (1.524 m)     Head Circumference      Peak Flow      Pain Score      Pain Loc      Pain Edu?      Excl.  in [place]?    Constitutional: Alert and oriented. Well appearing and in no acute distress. Eyes: Conjunctivae are normal. PERRL. EOMI. Head: Atraumatic. Nose: No congestion/rhinnorhea. Mouth/Throat: Mucous membranes are moist.  Oropharynx non-erythematous. Neck: No stridor.  Cardiovascular: Normal rate, regular rhythm. Grossly normal heart sounds.  Good peripheral circulation. Respiratory: Normal respiratory effort.  No retractions. Lungs CTAB. Gastrointestinal: Soft and nontender. No distention. No abdominal bruits. No CVA tenderness. Musculoskeletal: No lower extremity tenderness nor edema.  No joint effusions. Neurologic:  Normal speech and language. No gross focal neurologic deficits  are appreciated.  Skin:  Skin is warm, dry and intact. No rash noted. Psychiatric: Mood and affect are normal. Speech and behavior are normal.  ____________________________________________   LABS (all labs ordered are listed, but only abnormal results are displayed)  Labs Reviewed  URINALYSIS COMPLETEWITH MICROSCOPIC ([place]) - Abnormal; Notable for the following:       Result Value   Color, Urine AMBER (*)    APPearance CLOUDY (*)    Specific Gravity, Urine 1.032 (*)    Hgb urine dipstick 3+ (*)    Protein, ur 30 (*)    Leukocytes, UA 2+ (*)    Bacteria, UA MANY (*)    Squamous Epithelial / LPF 0-5 (*)    All other components within normal limits  URINE DRUG SCREEN, QUALITATIVE ([facility] ONLY) - Abnormal; Notable for the following:    Opiate, Ur Screen POSITIVE (*)    All other components within normal limits  CBC WITH DIFFERENTIAL/PLATELET - Abnormal; Notable for the following:    RBC 4.38 (*)    MCH 34.6 (*)    Platelets 136 (*)    Lymphs Abs 0.6 (*)    All other components within normal limits  COMPREHENSIVE METABOLIC PANEL - Abnormal; Notable for the following:    Glucose, Bld 149 (*)    Calcium 8.6 (*)    Albumin 3.4 (*)    All other components within normal limits  CULTURE, BLOOD (ROUTINE X 2)  CULTURE, BLOOD (ROUTINE X 2)  TROPONIN I  TSH  BRAIN NATRIURETIC PEPTIDE   ____________________________________________  EKG  EKG read and interpreted by me shows normal sinus rhythm rate of 81 normal axis no acute ST-T wave changes ____________________________________________  RADIOLOGY  INICAL DATA:  Initial valuation for acute fever, chills.  EXAM: PORTABLE CHEST 1 VIEW  COMPARISON:  None.  FINDINGS: The heart size and mediastinal contours are within normal limits. Both lungs are clear. The visualized skeletal structures are unremarkable.  IMPRESSION: No active disease.   Electronically Signed   By: Jeannine Boga M.D.   On: 10/27/2015  16:23 _____________________  __CT ABDOMEN AND PELVIS WITH CONTRAST  TECHNIQUE: Multidetector CT imaging of the abdomen and pelvis was performed using the standard protocol following bolus administration of intravenous contrast.  CONTRAST:  125mL ISOVUE-300 IOPAMIDOL (ISOVUE-300) INJECTION 61%  COMPARISON:  None.  FINDINGS: Lower chest: No acute abnormality.  Hepatobiliary: No focal liver abnormality is seen. No gallstones, gallbladder wall thickening, or biliary dilatation.  Pancreas: Unremarkable. No pancreatic ductal dilatation or surrounding inflammatory changes.  Spleen: Normal in size without focal abnormality.  Adrenals/Urinary Tract: Adrenal glands are unremarkable. Kidneys are normal, without renal calculi, solid mass, or hydronephrosis. 3.6 x 3.9 cm hypodense, fluid attenuating right renal mass most consistent with a cyst. Bladder is unremarkable.  Stomach/Bowel: Stomach is within normal limits. No evidence of bowel wall thickening, distention, or inflammatory changes. Appendix appears normal.  Vascular/Lymphatic: No significant vascular findings are  present. No enlarged abdominal or pelvic lymph nodes.  Reproductive: Prostate is unremarkable.  Other: No abdominal wall hernia or abnormality. No abdominopelvic ascites.  Musculoskeletal: No acute or significant osseous findings. No lytic or sclerotic osseous lesion.  IMPRESSION: No acute abdominal or pelvic pathology.   Electronically Signed   By: Kathreen Devoid   On: 10/27/2015 18:34_____________________   PROCEDURES  Procedure(s) performed:   Procedures  Critical Care performed:   ____________________________________________   INITIAL IMPRESSION / ASSESSMENT AND PLAN / ED COURSE  Pertinent labs & imaging results that were available during my care of the patient were reviewed by me and considered in my medical decision making (see chart for details).    Clinical Course      ____________________________________________   FINAL CLINICAL IMPRESSION(S) / ED DIAGNOSES  Final diagnoses:  UTI (lower urinary tract infection)      NEW MEDICATIONS STARTED DURING THIS VISIT:  New Prescriptions   No medications on file     Note:  This document was prepared using Dragon voice recognition software and may include unintentional dictation errors.    Nena Polio, MD 10/27/15 [phone]

## 2015-10-27 NOTE — ED Notes (Signed)
Pt aware urine needed for UA. Not able to provide at this time. Will recheck.

## 2015-10-27 NOTE — ED Triage Notes (Signed)
Pt is here with his wife who states the pt had his prostate biopsied last Thursday and since has had AMS, fever, chills, sweats.[facility]

## 2015-10-27 NOTE — ED Notes (Signed)
Temp recheck 98.1. PA student at bedside.

## 2015-10-27 NOTE — ED Notes (Signed)
Pt and family informed that after he receives Rocephin that they have to wait a little bit longer before D/C to make sure pt does not have a reaction. Pt and family verbalized understanding. Receiving medicine at this time. Laying on stretcher, in no apparent distress. No complaints of pain at this time.

## 2015-10-29 ENCOUNTER — Ambulatory Visit (INDEPENDENT_AMBULATORY_CARE_PROVIDER_SITE_OTHER): Payer: Commercial Managed Care - HMO | Admitting: Urology

## 2015-10-29 ENCOUNTER — Other Ambulatory Visit: Payer: Self-pay | Admitting: Urology

## 2015-10-29 ENCOUNTER — Encounter: Payer: Self-pay | Admitting: Urology

## 2015-10-29 VITALS — BP 111/77 | HR 88 | Temp 97.9°F | Ht 63.25 in | Wt 124.7 lb

## 2015-10-29 DIAGNOSIS — C61 Malignant neoplasm of prostate: Secondary | ICD-10-CM

## 2015-10-29 LAB — PATHOLOGY REPORT

## 2015-10-29 NOTE — Progress Notes (Signed)
10/29/2015 12:00 PM   Eugene Bennett 1945/08/20 [id number]  Referring provider: Glendon Axe, MD [facility] [place], [place] [address]  Chief Complaint  Patient presents with  . Prostate Cancer    f/u prostate biopsy    HPI: 70 year old white male was recently seen to establish primary care. The PSA was checked and noted to be 7.48. No prior history PSA screening. No family history of prostate cancer. He has some occasional frequency and urgency but he voids with a good stream. No gross hematuria or dysuria. No history of BPH but he does have issues with getting and maintaining an erection.  The patient underwent a prostate biopsy in April 2017 which was positive for of 12 cores for Gleason 3+3 = 6 prostate cancer. Volumes of the cores ranged from 27% to 85%.   In September 2017, the patient underwent a repeat biopsy. This showed only 1 core of Gleason 3+3 = 6 prostate cancer in the left lateral mid. It took up 22% of the core.     He did go to the emergency room for fever 102 and confusion. He was also having respiratory issues. He was family were also secured with respiratory issues. It is unclear if a prostate biopsy as a source of this infection as no urine culture was sent.  PMH: Past Medical History:  Diagnosis Date  . Cancer ([place])   . H/O malignant neoplasm of skin 12/12/2012  . Stomach ulcer     Surgical History: Past Surgical History:  Procedure Laterality Date  . COLONOSCOPY WITH PROPOFOL N/A 06/08/2015   Procedure: COLONOSCOPY WITH PROPOFOL;  Surgeon: Manya Silvas, MD;  Location: [facility] ENDOSCOPY;  Service: Endoscopy;  Laterality: N/A;  . skin cancer removal    . SKIN GRAFT  1980    Home Medications:    Medication List       Accurate as of 10/29/15 12:00 PM. Always use your most recent med list.          acetaminophen 500 MG tablet Commonly known as:  TYLENOL Take 1,000 mg by mouth every 6 (six) hours as needed.     cephALEXin 500 MG capsule Commonly known as:  KEFLEX Take 1 capsule (500 mg total) by mouth 4 (four) times daily.   cyanocobalamin 1000 MCG/ML injection Commonly known as:  (VITAMIN B-12) Inject 1,000 mcg into the muscle every 30 (thirty) days.   pantoprazole 40 MG tablet Commonly known as:  PROTONIX Take 40 mg by mouth daily. Reported on 06/05/2015   traMADol 50 MG tablet Commonly known as:  ULTRAM TAKE 1 TO 2 TABLETS BY MOUTH THREE TIMES DAILY AS NEEDED FOR PAIN       Allergies: No Known Allergies  Family History: No family history on file.  Social History:  reports that he has been smoking Cigarettes.  He has been smoking about 2.00 packs per day. He has never used smokeless tobacco. He reports that he drinks alcohol. He reports that he does not use drugs.  ROS: UROLOGY Frequent Urination?: No Hard to postpone urination?: No Burning/pain with urination?: No Get up at night to urinate?: No Leakage of urine?: No Urine stream starts and stops?: No Trouble starting stream?: No Do you have to strain to urinate?: No Blood in urine?: Yes Urinary tract infection?: No Sexually transmitted disease?: No Injury to kidneys or bladder?: No Painful intercourse?: No Weak stream?: No Erection problems?: No Penile pain?: No  Gastrointestinal Nausea?: No Vomiting?: No Indigestion/heartburn?:  No Diarrhea?: No Constipation?: No  Constitutional Fever: No Night sweats?: No Weight loss?: No Fatigue?: Yes  Skin Skin rash/lesions?: No Itching?: No  Eyes Blurred vision?: No Double vision?: No  Ears/Nose/Throat Sore throat?: No Sinus problems?: No  Hematologic/Lymphatic Swollen glands?: No Easy bruising?: No  Cardiovascular Leg swelling?: No Chest pain?: No  Respiratory Cough?: No Shortness of breath?: No  Endocrine Excessive thirst?: No  Musculoskeletal Back pain?: No Joint pain?: No  Neurological Headaches?: No Dizziness?:  No  Psychologic Depression?: No Anxiety?: No  Physical Exam: BP 111/77   Pulse 88   Temp 97.9 F (36.6 C) (Oral)   Ht 5' 3.25" (1.607 m)   Wt 124 lb 11.2 oz (56.6 kg)   SpO2 97%   BMI 21.92 kg/m   Constitutional:  Alert and oriented, No acute distress. HEENT: [place] AT, moist mucus membranes.  Trachea midline, no masses. Cardiovascular: No clubbing, cyanosis, or edema. Respiratory: Normal respiratory effort, no increased work of breathing. GI: Abdomen is soft, nontender, nondistended, no abdominal masses GU: No CVA tenderness.  Skin: No rashes, bruises or suspicious lesions. Lymph: No cervical or inguinal adenopathy. Neurologic: Grossly intact, no focal deficits, moving all 4 extremities. Psychiatric: Normal mood and affect.  Laboratory Data: Lab Results  Component Value Date   WBC 4.4 10/27/2015   HGB 15.1 10/27/2015   HCT 43.1 10/27/2015   MCV 98.4 10/27/2015   PLT 136 (L) 10/27/2015    Lab Results  Component Value Date   CREATININE 1.04 10/27/2015    No results found for: PSA  No results found for: TESTOSTERONE  No results found for: HGBA1C  Urinalysis    Component Value Date/Time   COLORURINE AMBER (A) 10/27/2015 1531   APPEARANCEUR CLOUDY (A) 10/27/2015 1531   LABSPEC 1.032 (H) 10/27/2015 1531   PHURINE 5.0 10/27/2015 1531   GLUCOSEU NEGATIVE 10/27/2015 1531   HGBUR 3+ (A) 10/27/2015 1531   BILIRUBINUR NEGATIVE 10/27/2015 1531   KETONESUR NEGATIVE 10/27/2015 1531   PROTEINUR 30 (A) 10/27/2015 1531   NITRITE NEGATIVE 10/27/2015 1531   LEUKOCYTESUR 2+ (A) 10/27/2015 1531      Assessment & Plan:   1. Low risk prostate cancer -Continue active surveillance. Repeat PSA and DRE in 6 months. -check urine culture to ensure no UTI at this time  Return in about 6 months (around 04/27/2016) for PSA prior.  Nickie Retort, MD  [facility] [address], [place], [place] [address] [phone]

## 2015-10-30 ENCOUNTER — Emergency Department: Payer: Commercial Managed Care - HMO

## 2015-10-30 DIAGNOSIS — Z85828 Personal history of other malignant neoplasm of skin: Secondary | ICD-10-CM | POA: Insufficient documentation

## 2015-10-30 DIAGNOSIS — R509 Fever, unspecified: Secondary | ICD-10-CM | POA: Diagnosis not present

## 2015-10-30 DIAGNOSIS — Z8546 Personal history of malignant neoplasm of prostate: Secondary | ICD-10-CM | POA: Diagnosis not present

## 2015-10-30 DIAGNOSIS — R51 Headache: Secondary | ICD-10-CM | POA: Diagnosis not present

## 2015-10-30 DIAGNOSIS — N390 Urinary tract infection, site not specified: Secondary | ICD-10-CM | POA: Insufficient documentation

## 2015-10-30 DIAGNOSIS — R1013 Epigastric pain: Secondary | ICD-10-CM | POA: Diagnosis not present

## 2015-10-30 DIAGNOSIS — R5383 Other fatigue: Secondary | ICD-10-CM | POA: Diagnosis not present

## 2015-10-30 DIAGNOSIS — Z79899 Other long term (current) drug therapy: Secondary | ICD-10-CM | POA: Diagnosis not present

## 2015-10-30 DIAGNOSIS — F17210 Nicotine dependence, cigarettes, uncomplicated: Secondary | ICD-10-CM | POA: Diagnosis not present

## 2015-10-30 DIAGNOSIS — R5081 Fever presenting with conditions classified elsewhere: Secondary | ICD-10-CM | POA: Diagnosis not present

## 2015-10-30 LAB — COMPREHENSIVE METABOLIC PANEL
ALK PHOS: 134 U/L — AB (ref 38–126)
ALT: 108 U/L — ABNORMAL HIGH (ref 17–63)
ANION GAP: 7 (ref 5–15)
AST: 102 U/L — ABNORMAL HIGH (ref 15–41)
Albumin: 3.3 g/dL — ABNORMAL LOW (ref 3.5–5.0)
BILIRUBIN TOTAL: 0.6 mg/dL (ref 0.3–1.2)
BUN: 14 mg/dL (ref 6–20)
CALCIUM: 8.6 mg/dL — AB (ref 8.9–10.3)
CO2: 23 mmol/L (ref 22–32)
Chloride: 107 mmol/L (ref 101–111)
Creatinine, Ser: 0.86 mg/dL (ref 0.61–1.24)
GFR calc Af Amer: >60 mL/min (ref >60)
Glucose, Bld: 150 mg/dL — ABNORMAL HIGH (ref 65–99)
POTASSIUM: 3.8 mmol/L (ref 3.5–5.1)
Sodium: 137 mmol/L (ref 135–145)
TOTAL PROTEIN: 6.5 g/dL (ref 6.5–8.1)

## 2015-10-30 LAB — CBC WITH DIFFERENTIAL/PLATELET
BASOS ABS: 0 10*3/uL (ref 0–0.1)
Basophils Relative: 1 %
EOS PCT: 2 %
Eosinophils Absolute: 0.1 10*3/uL (ref 0–0.7)
HEMATOCRIT: 40.2 % (ref 40.0–52.0)
Hemoglobin: 14.1 g/dL (ref 13.0–18.0)
Lymphocytes Relative: 10 %
Lymphs Abs: 0.7 10*3/uL — ABNORMAL LOW (ref 1.0–3.6)
MCH: 34.3 pg — AB (ref 26.0–34.0)
MCHC: 35 g/dL (ref 32.0–36.0)
MCV: 98 fL (ref 80.0–100.0)
MONOS PCT: 4 %
Monocytes Absolute: 0.3 10*3/uL (ref 0.2–1.0)
NEUTROS ABS: 5.9 10*3/uL (ref 1.4–6.5)
NEUTROS PCT: 83 %
Platelets: 160 10*3/uL (ref 150–440)
RBC: 4.11 MIL/uL — ABNORMAL LOW (ref 4.40–5.90)
RDW: 13.4 % (ref 11.5–14.5)
WBC: 7 10*3/uL (ref 3.8–10.6)

## 2015-10-30 LAB — LACTIC ACID, PLASMA: LACTIC ACID, VENOUS: 1.6 mmol/L (ref 0.5–1.9)

## 2015-10-30 NOTE — ED Triage Notes (Signed)
Pt states had recent colonoscopy for colon cancer. Pt states he has had a fever today of 105 at home. Pt states "i feel like I have no energy." pt states last took antipyretic at this am. Pt complains of headache, dry cough, no vomiting or diarrhea.

## 2015-10-31 ENCOUNTER — Emergency Department: Payer: Commercial Managed Care - HMO

## 2015-10-31 ENCOUNTER — Emergency Department
Admission: EM | Admit: 2015-10-31 | Discharge: 2015-10-31 | Disposition: A | Discharge status: Home / Self Care | Payer: Commercial Managed Care - HMO | Attending: Emergency Medicine | Admitting: Emergency Medicine

## 2015-10-31 DIAGNOSIS — R51 Headache: Secondary | ICD-10-CM | POA: Diagnosis not present

## 2015-10-31 DIAGNOSIS — R5383 Other fatigue: Secondary | ICD-10-CM

## 2015-10-31 DIAGNOSIS — N390 Urinary tract infection, site not specified: Secondary | ICD-10-CM

## 2015-10-31 DIAGNOSIS — R1013 Epigastric pain: Secondary | ICD-10-CM | POA: Diagnosis not present

## 2015-10-31 DIAGNOSIS — R509 Fever, unspecified: Secondary | ICD-10-CM

## 2015-10-31 DIAGNOSIS — R109 Unspecified abdominal pain: Secondary | ICD-10-CM

## 2015-10-31 LAB — URINALYSIS COMPLETE WITH MICROSCOPIC (ARMC ONLY)
BILIRUBIN URINE: NEGATIVE
GLUCOSE, UA: NEGATIVE mg/dL
KETONES UR: NEGATIVE mg/dL
Leukocytes, UA: NEGATIVE
Nitrite: NEGATIVE
Protein, ur: 30 mg/dL — AB
SQUAMOUS EPITHELIAL / LPF: NONE SEEN
Specific Gravity, Urine: 1.035 — ABNORMAL HIGH (ref 1.005–1.030)
pH: 5 (ref 5.0–8.0)

## 2015-10-31 LAB — MONONUCLEOSIS SCREEN: Mono Screen: NEGATIVE

## 2015-10-31 LAB — AMMONIA: AMMONIA: 34 umol/L (ref 9–35)

## 2015-10-31 MED ORDER — SODIUM CHLORIDE 0.9 % IV BOLUS (SEPSIS)
1000.0000 mL | Freq: Once | INTRAVENOUS | Status: AC
  Administered 2015-10-31: 1000 mL via INTRAVENOUS

## 2015-10-31 MED ORDER — METOCLOPRAMIDE HCL 5 MG/ML IJ SOLN
10.0000 mg | Freq: Once | INTRAMUSCULAR | Status: AC
Start: 2015-10-31 — End: 2015-10-31
  Administered 2015-10-31: 10 mg via INTRAVENOUS
  Filled 2015-10-31: qty 2

## 2015-10-31 MED ORDER — DIPHENHYDRAMINE HCL 50 MG/ML IJ SOLN
25.0000 mg | Freq: Once | INTRAMUSCULAR | Status: AC
  Administered 2015-10-31: 25 mg via INTRAVENOUS
  Filled 2015-10-31: qty 1

## 2015-10-31 NOTE — ED Provider Notes (Signed)
[facility] Emergency Department Provider Note   ____________________________________________   First MD Initiated Contact with Patient 10/31/15 0104     (approximate)  I have reviewed the triage vital signs and the nursing notes.   HISTORY  Chief Complaint Fever    HPI Eugene Bennett is a 70 y.o. male who comes into al today for his visit this week. The patient had aprostate biopsy for prostate cancer last week. On Saturday he had a temperature that was 100-101. It went on for a few days and he had some cough with congestion and sweats. The patient's family has been treating it with Tylenol but called the urologist on Tuesday and was told to come to the emergency department. He was worked up on Tuesday and told that he had an infection in his urine. The patient was given Rocephin as well as Keflex which she has been taking daily. He had 2 no temperature on Tuesday and Wednesday until this evening. The patient did not eat much for lunch and hasn't drank much. He reports that he is exhausted. His temperature is 100.4. He was given Tylenol and Keflex as well as NyQuil at 6 PM. They contacted the urologist again and the recommendation was for the patient come back into the hospital. He did have some epigastric abdominal pain which is improved at this time and he has also been complaining of some headache. He has been dizzy and unsteady on his feet as well. The family is concerned because the patient has been talking in his sleep and said that if it's time for him to go he is ready forgot to take him. He is here for evaluation. The patient's family reports that he's had headaches more often over the past few months.   Past Medical History:  Diagnosis Date  . Cancer ([place])   . H/O malignant neoplasm of skin 12/12/2012  . Stomach ulcer     Patient Active Problem List   Diagnosis Date Noted  . Pain in shoulder 05/19/2015  . Gastric catarrh 05/19/2015  . Current  smoker 05/19/2015  . B12 deficiency 05/19/2015  . Elevated PSA 04/13/2015  . H/O malignant neoplasm of skin 12/12/2012    Past Surgical History:  Procedure Laterality Date  . COLONOSCOPY WITH PROPOFOL N/A 06/08/2015   Procedure: COLONOSCOPY WITH PROPOFOL;  Surgeon: Manya Silvas, MD;  Location: [facility] ENDOSCOPY;  Service: Endoscopy;  Laterality: N/A;  . skin cancer removal    . SKIN GRAFT  1980    Prior to Admission medications   Medication Sig Start Date End Date Taking? Authorizing Provider  acetaminophen (TYLENOL) 500 MG tablet Take 1,000 mg by mouth every 6 (six) hours as needed.    Historical Provider, MD  cephALEXin (KEFLEX) 500 MG capsule Take 1 capsule (500 mg total) by mouth 4 (four) times daily. 10/27/15 11/06/15  Nena Polio, MD  cyanocobalamin (,VITAMIN B-12,) 1000 MCG/ML injection Inject 1,000 mcg into the muscle every 30 (thirty) days.  03/27/15   Historical Provider, MD  pantoprazole (PROTONIX) 40 MG tablet Take 40 mg by mouth daily. Reported on 06/05/2015 05/19/15 05/18/16  Historical Provider, MD  traMADol (ULTRAM) 50 MG tablet TAKE 1 TO 2 TABLETS BY MOUTH THREE TIMES DAILY AS NEEDED FOR PAIN 10/12/15   Historical Provider, MD    Allergies Review of patient's allergies indicates no known allergies.  No family history on file.  Social History Social History  Substance Use Topics  . Smoking status: Current Some  Day Smoker    Packs/day: 2.00    Types: Cigarettes  . Smokeless tobacco: Never Used  . Alcohol use 0.0 oz/week    Review of Systems Constitutional: Fever and sweats Eyes: No visual changes. ENT: No sore throat. Cardiovascular: Denies chest pain. Respiratory: Denies shortness of breath. Gastrointestinal: No abdominal pain.  No nausea, no vomiting.  No diarrhea.  No constipation. Genitourinary: Negative for dysuria. Musculoskeletal: Negative for back pain. Skin: Negative for rash. Neurological: Headache  10-point ROS otherwise  negative.  ____________________________________________   PHYSICAL EXAM:  VITAL SIGNS: ED Triage Vitals [10/30/15 2036]  Enc Vitals Group     BP (!) 117/99     Pulse Rate (!) 108     Resp 16     Temp 98.2 F (36.8 C)     Temp Source Oral     SpO2 96 %     Weight 124 lb (56.2 kg)     Height 5' (1.524 m)     Head Circumference      Peak Flow      Pain Score 0     Pain Loc      Pain Edu?      Excl. in [place]?     Constitutional: Alert and oriented. Well appearing Sleeping on the stretcher Eyes: Conjunctivae are normal. PERRL. EOMI. Head: Atraumatic. Nose: No congestion/rhinnorhea. Mouth/Throat: Mucous membranes are moist.  Oropharynx non-erythematous. Cardiovascular: Normal rate, regular rhythm. Grossly normal heart sounds.  Good peripheral circulation. Respiratory: Normal respiratory effort.  No retractions. Lungs CTAB. Gastrointestinal: Soft and nontender. No distention. Positive bowel sounds Musculoskeletal: No lower extremity tenderness nor edema.   Neurologic:  Normal speech and language.  Skin:  Skin is warm, dry and intact.  Psychiatric: Mood and affect are normal.   ____________________________________________   LABS (all labs ordered are listed, but only abnormal results are displayed)  Labs Reviewed  COMPREHENSIVE METABOLIC PANEL - Abnormal; Notable for the following:       Result Value   Glucose, Bld 150 (*)    Calcium 8.6 (*)    Albumin 3.3 (*)    AST 102 (*)    ALT 108 (*)    Alkaline Phosphatase 134 (*)    All other components within normal limits  CBC WITH DIFFERENTIAL/PLATELET - Abnormal; Notable for the following:    RBC 4.11 (*)    MCH 34.3 (*)    Lymphs Abs 0.7 (*)    All other components within normal limits  URINALYSIS COMPLETEWITH MICROSCOPIC ([facility] ONLY) - Abnormal; Notable for the following:    Color, Urine YELLOW (*)    APPearance CLEAR (*)    Specific Gravity, Urine 1.035 (*)    Hgb urine dipstick 3+ (*)    Protein, ur 30 (*)     Bacteria, UA RARE (*)    All other components within normal limits  CULTURE, BLOOD (ROUTINE X 2)  CULTURE, BLOOD (ROUTINE X 2)  LACTIC ACID, PLASMA  MONONUCLEOSIS SCREEN  AMMONIA   ____________________________________________  EKG  ED ECG REPORT I, Loney Hering, the attending physician, personally viewed and interpreted this ECG.   Date: 10/31/2015  EKG Time: 2101  Rate: 92  Rhythm: normal sinus rhythm  Axis: Normal  Intervals:none  ST&T Change: None  ____________________________________________  RADIOLOGY  Chest x-ray CT head ____________________________________________   PROCEDURES  Procedure(s) performed: None  Procedures  Critical Care performed: No  ____________________________________________   INITIAL IMPRESSION / ASSESSMENT AND PLAN / ED COURSE  Pertinent labs &  imaging results that were available during my care of the patient were reviewed by me and considered in my medical decision making (see chart for details).  This is a 70 year old male who comes into the hospital today with a fever. The patient was seen and evaluated for fever previously. The patient did have a UTI and was treated with Rocephin as well as Keflex. It does not appear that the patient had a urine culture. The patient is sleeping right now but his blood work does appear unremarkable. Given his previous headache L give the patient dose of Reglan and Benadryl as well as a liter of normal saline. I will reassess the patient after I performed a CT head given his confusion as well as his headaches. The patient is resting comfortably at this time.  Clinical Course  Value Comment By Time  DG Chest 2 View No active cardiopulmonary disease. Loney Hering, MD 09/16 [phone]  US Abdomen Limited RUQ No evidence of cholecystitis.  Minimal gallbladder adenomyomatosis. Loney Hering, MD 09/16 [phone]  CT Head Wo Contrast Unremarkable noncontrast CT of the head. Loney Hering, MD 09/16  0405    The patient's blood work and imaging is unremarkable. I had a discussion with his family about continuing his antibiotics. The patient did have a significant appearing UTI previously. They agree with that plan. The patient's headache is gone and he is sitting up and is now hungry and ready to go home. The patient will be discharged to home.  ____________________________________________   FINAL CLINICAL IMPRESSION(S) / ED DIAGNOSES  Final diagnoses:  Abdominal pain  Other specified fever  UTI (lower urinary tract infection)  Other fatigue      NEW MEDICATIONS STARTED DURING THIS VISIT:  New Prescriptions   No medications on file     Note:  This document was prepared using Dragon voice recognition software and may include unintentional dictation errors.    Loney Hering, MD 10/31/15 [phone]

## 2015-10-31 NOTE — ED Notes (Signed)
MD Webster at bedside 

## 2015-10-31 NOTE — ED Notes (Signed)
Patient transported to CT 

## 2015-10-31 NOTE — ED Notes (Signed)
Pt had prostate biopsy on 10/22/15. Pt began to c/o general malaise on 10/24/15. Pt had temperature on 100.5 at the time. Pt reports Tmax was 101. Pt continued to have low grade fever throughout the weekend. Pt c/o diaphoretic, congested, and fatigued. Family called urologist on Tuesday and was told to go to ED. Pt not symptomatic at ED. Pt was dx with UTI and given rocephin and sent home with Keflex. Pt followed up with urology on Thursday. Urologist told pt's family that pt did not have UTI b/c no urine culture was peformed. Urologist took urine culture, however results not back. Urologist told family he believed Pt may have URI due to cough and sinus congestion. Pt began to have temperature of 100.4 again tonight, called urology, and was told to come back to ED.

## 2015-10-31 NOTE — ED Notes (Signed)
Reviewed d/c instructions, follow-up care with pt. Pt verbalized understanding 

## 2015-10-31 NOTE — ED Notes (Signed)
Patient transported to Ultrasound 

## 2015-11-01 LAB — CULTURE, BLOOD (ROUTINE X 2)
CULTURE: NO GROWTH
CULTURE: NO GROWTH

## 2015-11-01 LAB — CULTURE, URINE COMPREHENSIVE

## 2015-11-02 ENCOUNTER — Telehealth: Payer: Self-pay

## 2015-11-02 NOTE — Telephone Encounter (Signed)
Pt wife called after hours triage line stating pt has developed another fever of 100.4, chills, and abd pain. Per wife after hours triage referred pt to ER. Wife stated that pt did go to the ER. Per wife ER dx pt with UTI again, gave fluids, and drew more blood. Pt was seen by Dr. Pilar Jarvis on 10/29/15 and a ucx was performed in clinic. Made wife aware ucx results were negative. Wife stated she was able to see on mychart lab work from ER was also negative. Wife voiced concern stating "I feel like we are getting the run around as to what is wrong with my husband." Reinforced with pt to call PCP and have pt evaluated as all of urological test have been negative. Wife voiced understanding of whole conversation.

## 2015-11-04 LAB — CULTURE, BLOOD (ROUTINE X 2): Culture: NO GROWTH

## 2015-11-05 LAB — CULTURE, BLOOD (ROUTINE X 2): CULTURE: NO GROWTH

## 2015-11-06 DIAGNOSIS — E538 Deficiency of other specified B group vitamins: Secondary | ICD-10-CM | POA: Diagnosis not present

## 2015-11-17 DIAGNOSIS — K2970 Gastritis, unspecified, without bleeding: Secondary | ICD-10-CM | POA: Diagnosis not present

## 2015-11-17 DIAGNOSIS — R202 Paresthesia of skin: Secondary | ICD-10-CM | POA: Diagnosis not present

## 2015-11-17 DIAGNOSIS — E538 Deficiency of other specified B group vitamins: Secondary | ICD-10-CM | POA: Diagnosis not present

## 2015-11-17 DIAGNOSIS — Z23 Encounter for immunization: Secondary | ICD-10-CM | POA: Diagnosis not present

## 2015-11-17 DIAGNOSIS — K2990 Gastroduodenitis, unspecified, without bleeding: Secondary | ICD-10-CM | POA: Diagnosis not present

## 2015-11-17 DIAGNOSIS — G44219 Episodic tension-type headache, not intractable: Secondary | ICD-10-CM | POA: Diagnosis not present

## 2016-02-18 DIAGNOSIS — E538 Deficiency of other specified B group vitamins: Secondary | ICD-10-CM | POA: Diagnosis not present

## 2016-02-25 DIAGNOSIS — E538 Deficiency of other specified B group vitamins: Secondary | ICD-10-CM | POA: Diagnosis not present

## 2016-02-25 DIAGNOSIS — K2990 Gastroduodenitis, unspecified, without bleeding: Secondary | ICD-10-CM | POA: Diagnosis not present

## 2016-02-25 DIAGNOSIS — R030 Elevated blood-pressure reading, without diagnosis of hypertension: Secondary | ICD-10-CM | POA: Diagnosis not present

## 2016-02-25 DIAGNOSIS — F17200 Nicotine dependence, unspecified, uncomplicated: Secondary | ICD-10-CM | POA: Diagnosis not present

## 2016-02-25 DIAGNOSIS — K2970 Gastritis, unspecified, without bleeding: Secondary | ICD-10-CM | POA: Diagnosis not present

## 2016-02-25 DIAGNOSIS — F4321 Adjustment disorder with depressed mood: Secondary | ICD-10-CM | POA: Diagnosis not present

## 2016-03-29 DIAGNOSIS — K648 Other hemorrhoids: Secondary | ICD-10-CM | POA: Diagnosis not present

## 2016-03-29 DIAGNOSIS — D123 Benign neoplasm of transverse colon: Secondary | ICD-10-CM | POA: Diagnosis not present

## 2016-03-29 DIAGNOSIS — Z86010 Personal history of colonic polyps: Secondary | ICD-10-CM | POA: Diagnosis not present

## 2016-03-29 DIAGNOSIS — D126 Benign neoplasm of colon, unspecified: Secondary | ICD-10-CM | POA: Diagnosis not present

## 2016-03-29 DIAGNOSIS — K635 Polyp of colon: Secondary | ICD-10-CM | POA: Diagnosis not present

## 2016-03-29 DIAGNOSIS — K640 First degree hemorrhoids: Secondary | ICD-10-CM | POA: Diagnosis not present

## 2016-03-29 DIAGNOSIS — K5730 Diverticulosis of large intestine without perforation or abscess without bleeding: Secondary | ICD-10-CM | POA: Diagnosis not present

## 2016-03-29 DIAGNOSIS — D125 Benign neoplasm of sigmoid colon: Secondary | ICD-10-CM | POA: Diagnosis not present

## 2016-03-29 DIAGNOSIS — D122 Benign neoplasm of ascending colon: Secondary | ICD-10-CM | POA: Diagnosis not present

## 2016-03-29 DIAGNOSIS — D124 Benign neoplasm of descending colon: Secondary | ICD-10-CM | POA: Diagnosis not present

## 2016-04-01 DIAGNOSIS — H2513 Age-related nuclear cataract, bilateral: Secondary | ICD-10-CM | POA: Diagnosis not present

## 2016-04-21 ENCOUNTER — Other Ambulatory Visit: Payer: Commercial Managed Care - HMO

## 2016-04-28 ENCOUNTER — Ambulatory Visit: Payer: Commercial Managed Care - HMO

## 2016-05-02 DIAGNOSIS — H2513 Age-related nuclear cataract, bilateral: Secondary | ICD-10-CM | POA: Diagnosis not present

## 2016-05-17 ENCOUNTER — Encounter: Payer: Self-pay | Admitting: *Deleted

## 2016-05-19 ENCOUNTER — Ambulatory Visit: Payer: Medicare HMO | Admitting: Certified Registered"

## 2016-05-19 ENCOUNTER — Encounter: Payer: Self-pay | Admitting: Anesthesiology

## 2016-05-19 ENCOUNTER — Ambulatory Visit
Admission: RE | Admit: 2016-05-19 | Discharge: 2016-05-19 | Disposition: A | Discharge status: Home / Self Care | Payer: Medicare HMO | Source: Ambulatory Visit | Attending: Ophthalmology | Admitting: Ophthalmology

## 2016-05-19 ENCOUNTER — Encounter
Admission: RE | Disposition: A | Discharge status: Home / Self Care | Payer: Self-pay | Source: Ambulatory Visit | Attending: Ophthalmology

## 2016-05-19 DIAGNOSIS — H2512 Age-related nuclear cataract, left eye: Secondary | ICD-10-CM | POA: Insufficient documentation

## 2016-05-19 DIAGNOSIS — F329 Major depressive disorder, single episode, unspecified: Secondary | ICD-10-CM | POA: Diagnosis not present

## 2016-05-19 DIAGNOSIS — F17200 Nicotine dependence, unspecified, uncomplicated: Secondary | ICD-10-CM | POA: Insufficient documentation

## 2016-05-19 DIAGNOSIS — H2513 Age-related nuclear cataract, bilateral: Secondary | ICD-10-CM | POA: Diagnosis not present

## 2016-05-19 HISTORY — DX: Depression, unspecified: F32.A

## 2016-05-19 HISTORY — DX: Unspecified hearing loss, unspecified ear: H91.90

## 2016-05-19 HISTORY — DX: Major depressive disorder, single episode, unspecified: F32.9

## 2016-05-19 HISTORY — PX: CATARACT EXTRACTION W/PHACO: SHX586

## 2016-05-19 HISTORY — DX: Anemia, unspecified: D64.9

## 2016-05-19 SURGERY — PHACOEMULSIFICATION, CATARACT, WITH IOL INSERTION
Anesthesia: Monitor Anesthesia Care | Site: Eye | Laterality: Left | Wound class: Clean

## 2016-05-19 MED ORDER — POVIDONE-IODINE 5 % OP SOLN
OPHTHALMIC | Status: DC | PRN
  Administered 2016-05-19: 1 via OPHTHALMIC

## 2016-05-19 MED ORDER — MOXIFLOXACIN HCL 0.5 % OP SOLN: 1.0000 [drp] | OPHTHALMIC | Status: DC | PRN

## 2016-05-19 MED ORDER — FENTANYL CITRATE (PF) 100 MCG/2ML IJ SOLN
INTRAMUSCULAR | Status: AC
Start: 2016-05-19 — End: ?
  Filled 2016-05-19: qty 2

## 2016-05-19 MED ORDER — SODIUM HYALURONATE 10 MG/ML IO SOLN
INTRAOCULAR | Status: DC | PRN
  Administered 2016-05-19: 0.55 mL via INTRAOCULAR

## 2016-05-19 MED ORDER — SODIUM HYALURONATE 10 MG/ML IO SOLN
INTRAOCULAR | Status: AC
  Filled 2016-05-19: qty 0.85

## 2016-05-19 MED ORDER — LIDOCAINE HCL (PF) 2 % IJ SOLN
INTRAMUSCULAR | Status: AC
  Filled 2016-05-19: qty 2

## 2016-05-19 MED ORDER — EPINEPHRINE PF 1 MG/ML IJ SOLN
INTRAMUSCULAR | Status: AC
  Filled 2016-05-19: qty 2

## 2016-05-19 MED ORDER — SODIUM HYALURONATE 23 MG/ML IO SOLN
INTRAOCULAR | Status: DC | PRN
  Administered 2016-05-19: 0.6 mL via INTRAOCULAR

## 2016-05-19 MED ORDER — MIDAZOLAM HCL 2 MG/2ML IJ SOLN
INTRAMUSCULAR | Status: AC
  Filled 2016-05-19: qty 2

## 2016-05-19 MED ORDER — SODIUM HYALURONATE 23 MG/ML IO SOLN
INTRAOCULAR | Status: AC
  Filled 2016-05-19: qty 0.6

## 2016-05-19 MED ORDER — EPINEPHRINE PF 1 MG/ML IJ SOLN
INTRAOCULAR | Status: DC | PRN
  Administered 2016-05-19: 10:00:00 via OPHTHALMIC

## 2016-05-19 MED ORDER — ARMC OPHTHALMIC DILATING DROPS
1.0000 "application " | OPHTHALMIC | Status: AC
  Administered 2016-05-19 (×3): 1 via OPHTHALMIC

## 2016-05-19 MED ORDER — MOXIFLOXACIN HCL 0.5 % OP SOLN
OPHTHALMIC | Status: AC
  Filled 2016-05-19: qty 3

## 2016-05-19 MED ORDER — MIDAZOLAM HCL 2 MG/2ML IJ SOLN
INTRAMUSCULAR | Status: DC | PRN
  Administered 2016-05-19 (×2): 1 mg via INTRAVENOUS

## 2016-05-19 MED ORDER — SODIUM CHLORIDE 0.9 % IV SOLN
INTRAVENOUS | Status: DC
  Administered 2016-05-19: 09:00:00 via INTRAVENOUS

## 2016-05-19 MED ORDER — POVIDONE-IODINE 5 % OP SOLN
OPHTHALMIC | Status: AC
  Filled 2016-05-19: qty 30

## 2016-05-19 MED ORDER — ARMC OPHTHALMIC DILATING DROPS
OPHTHALMIC | Status: AC
  Administered 2016-05-19: 1 via OPHTHALMIC
  Filled 2016-05-19: qty 0.4

## 2016-05-19 MED ORDER — MOXIFLOXACIN HCL 0.5 % OP SOLN
OPHTHALMIC | Status: DC | PRN
  Administered 2016-05-19: 0.2 mL via OPHTHALMIC

## 2016-05-19 MED ORDER — LIDOCAINE HCL (PF) 4 % IJ SOLN
INTRAMUSCULAR | Status: DC | PRN
  Administered 2016-05-19: 4 mL via OPHTHALMIC

## 2016-05-19 SURGICAL SUPPLY — 20 items
CANNULA ANT/CHMB 27GA (MISCELLANEOUS) ×6 IMPLANT
CUP MEDICINE 2OZ PLAST GRAD ST (MISCELLANEOUS) ×3 IMPLANT
DISSECTOR HYDRO NUCLEUS 50X22 (MISCELLANEOUS) ×3 IMPLANT
GLOVE BIO SURGEON STRL SZ8 (GLOVE) ×3 IMPLANT
GLOVE BIOGEL M 6.5 STRL (GLOVE) ×3 IMPLANT
GLOVE SURG LX 7.5 STRW (GLOVE) ×2
GLOVE SURG LX STRL 7.5 STRW (GLOVE) ×1 IMPLANT
GOWN STRL REUS W/ TWL LRG LVL3 (GOWN DISPOSABLE) ×2 IMPLANT
GOWN STRL REUS W/TWL LRG LVL3 (GOWN DISPOSABLE) ×4
LENS IOL TECNIS ITEC 24.5 (Intraocular Lens) ×3 IMPLANT
PACK CATARACT (MISCELLANEOUS) ×3 IMPLANT
PACK CATARACT KING (MISCELLANEOUS) ×3 IMPLANT
PACK EYE AFTER SURG (MISCELLANEOUS) ×3 IMPLANT
SOL BSS BAG (MISCELLANEOUS) ×3
SOLUTION BSS BAG (MISCELLANEOUS) ×1 IMPLANT
SYR 3ML LL SCALE MARK (SYRINGE) ×6 IMPLANT
SYR 5ML LL (SYRINGE) ×3 IMPLANT
SYR TB 1ML 27GX1/2 LL (SYRINGE) ×3 IMPLANT
WATER STERILE IRR 250ML POUR (IV SOLUTION) ×3 IMPLANT
WIPE NON LINTING 3.25X3.25 (MISCELLANEOUS) ×3 IMPLANT

## 2016-05-19 NOTE — Discharge Instructions (Signed)
Eye Surgery Discharge Instructions  Expect mild scratchy sensation or mild soreness. DO NOT RUB YOUR EYE!  The day of surgery:  Minimal physical activity, but bed rest is not required  No reading, computer work, or close hand work  No bending, lifting, or straining.  May watch TV  For 24 hours:  No driving, legal decisions, or alcoholic beverages  Safety precautions  Eat anything you prefer: It is better to start with liquids, then soup then solid foods.  _____ Eye patch should be worn until postoperative exam tomorrow.  ____ Solar shield eyeglasses should be worn for comfort in the sunlight/patch while sleeping  Resume all regular medications including aspirin or Coumadin if these were discontinued prior to surgery. You may shower, bathe, shave, or wash your hair. Tylenol may be taken for mild discomfort.  Call your doctor if you experience significant pain, nausea, or vomiting, fever > 101 or other signs of infection. [phone] or [phone] Specific instructions:  Follow-up Information    Benay Pillow, MD Follow up.   Specialty:  Ophthalmology Why:  April 6 at 10:10am Contact information: [address] [place] [address] [phone]

## 2016-05-19 NOTE — H&P (Signed)
The History and Physical notes are on paper, have been signed, and are to be scanned.   I have examined the patient and there are no changes to the H&P.   Benay Pillow 05/19/2016 9:45 AM

## 2016-05-19 NOTE — Op Note (Signed)
OPERATIVE NOTE  Eugene Bennett [id number] 05/19/2016   PREOPERATIVE DIAGNOSIS:  Nuclear sclerotic cataract left eye.  H25.12   POSTOPERATIVE DIAGNOSIS:    Nuclear sclerotic cataract left eye.     PROCEDURE:  Phacoemusification with posterior chamber intraocular lens placement of the left eye   LENS:   Implant Name Type Inv. Item Serial No. Manufacturer Lot No. LRB No. Used  LENS IOL DIOP 24.5 - [id number] Intraocular Lens LENS IOL DIOP 24.5 [id number]   Left 1       PCB00 +24.5   ULTRASOUND TIME: 0 minutes 46.9 seconds.  CDE 8.33   SURGEON:  Benay Pillow, MD, MPH   ANESTHESIA:  Topical with tetracaine drops augmented with 1% preservative-free intracameral lidocaine.  ESTIMATED BLOOD LOSS: <1 mL   COMPLICATIONS:  None.   DESCRIPTION OF PROCEDURE:  The patient was identified in the holding room and transported to the operating room and placed in the supine position under the operating microscope.  The left eye was identified as the operative eye and it was prepped and draped in the usual sterile ophthalmic fashion.   A 1.0 millimeter clear-corneal paracentesis was made at the 5:00 position. 0.5 ml of preservative-free 1% lidocaine with epinephrine was injected into the anterior chamber.  The anterior chamber was filled with Healon 5 viscoelastic.  A 2.4 millimeter keratome was used to make a near-clear corneal incision at the 2:00 position.  A curvilinear capsulorrhexis was made with a cystotome and capsulorrhexis forceps.  Balanced salt solution was used to hydrodissect and hydrodelineate the nucleus.   Phacoemulsification was then used in stop and chop fashion to remove the lens nucleus and epinucleus.  The remaining cortex was then removed using the irrigation and aspiration handpiece. Healon was then placed into the capsular bag to distend it for lens placement.  A lens was then injected into the capsular bag.  The remaining viscoelastic was aspirated.  There was significant  positive posterior pressure throughout the case, so capsular polishing was minimal, there was trace posterior opacification remaining, unlikely to be visually significant.   Wounds were hydrated with balanced salt solution.  The anterior chamber was inflated to a physiologic pressure with balanced salt solution.   Intracameral vigamox 0.1 mL undiltued was injected into the eye and a drop placed onto the ocular surface.  No wound leaks were noted.  The patient was taken to the recovery room in stable condition without complications of anesthesia or surgery  Benay Pillow 05/19/2016, 10:25 AM

## 2016-05-19 NOTE — Transfer of Care (Signed)
Immediate Anesthesia Transfer of Care Note  Patient: Eugene Bennett  Procedure(s) Performed: Procedure(s) with comments: CATARACT EXTRACTION PHACO AND INTRAOCULAR LENS PLACEMENT (IOC) (Left) - [place] 46.9 AP% 16.8 CDE 8.33 Fluid pack lot # [id number]  Patient Location: Short Stay  Anesthesia Type:MAC  Level of Consciousness: awake, alert  and oriented  Airway & Oxygen Therapy: Patient Spontanous Breathing  Post-op Assessment: Report given to RN and Post -op Vital signs reviewed and stable  Post vital signs: Reviewed  Last Vitals:  Vitals:   05/19/16 0842 05/19/16 1028  BP: (!) 138/52 (!) 144/64  Pulse: 69   Resp: 20 16  Temp: 36.7 C 36.1 C    Last Pain:  Vitals:   05/19/16 0842  TempSrc: Oral      Patients Stated Pain Goal: 0 (28/97/91 5041)  Complications: No apparent anesthesia complications

## 2016-05-19 NOTE — Anesthesia Preprocedure Evaluation (Signed)
Anesthesia Evaluation  Patient identified by MRN, date of birth, ID band Patient awake    Reviewed: Allergy & Precautions, NPO status , Patient's Chart, lab work & pertinent test results, reviewed documented beta blocker date and time   Airway Mallampati: II  TM Distance: >3 FB     Dental  (+) Chipped   Pulmonary Current Smoker,           Cardiovascular      Neuro/Psych PSYCHIATRIC DISORDERS Depression    GI/Hepatic   Endo/Other    Renal/GU      Musculoskeletal   Abdominal   Peds  Hematology   Anesthesia Other Findings   Reproductive/Obstetrics                             Anesthesia Physical Anesthesia Plan  ASA: II  Anesthesia Plan: MAC   Post-op Pain Management:    Induction:   Airway Management Planned:   Additional Equipment:   Intra-op Plan:   Post-operative Plan:   Informed Consent: I have reviewed the patients History and Physical, chart, labs and discussed the procedure including the risks, benefits and alternatives for the proposed anesthesia with the patient or authorized representative who has indicated his/her understanding and acceptance.     Plan Discussed with: CRNA  Anesthesia Plan Comments:         Anesthesia Quick Evaluation

## 2016-05-19 NOTE — Anesthesia Postprocedure Evaluation (Signed)
Anesthesia Post Note  Patient: Eugene Bennett  Procedure(s) Performed: Procedure(s) (LRB): CATARACT EXTRACTION PHACO AND INTRAOCULAR LENS PLACEMENT (IOC) (Left)  Patient location during evaluation: Short Stay Anesthesia Type: MAC Level of consciousness: awake and alert Pain management: pain level controlled Vital Signs Assessment: post-procedure vital signs reviewed and stable Respiratory status: spontaneous breathing, nonlabored ventilation, respiratory function stable and patient connected to nasal cannula oxygen Cardiovascular status: blood pressure returned to baseline and stable Postop Assessment: no signs of nausea or vomiting Anesthetic complications: no     Last Vitals:  Vitals:   05/19/16 0842 05/19/16 1028  BP: (!) 138/52 (!) 144/64  Pulse: 69   Resp: 20 16  Temp: 36.7 C 36.1 C    Last Pain:  Vitals:   05/19/16 0842  TempSrc: Oral                 Brantley Fling

## 2016-05-19 NOTE — Anesthesia Post-op Follow-up Note (Cosign Needed)
Anesthesia QCDR form completed.        

## 2016-05-24 ENCOUNTER — Other Ambulatory Visit: Payer: Medicare HMO

## 2016-05-24 DIAGNOSIS — C61 Malignant neoplasm of prostate: Secondary | ICD-10-CM | POA: Diagnosis not present

## 2016-05-24 NOTE — Progress Notes (Signed)
05/25/2016 2:35 PM   Eugene Bennett 1946/02/05 [id number]  Referring provider: Glendon Axe, MD [facility] [place], [place] [address]  Chief Complaint  Patient presents with  . Prostate Cancer    6 month follow up     HPI: 71 yo WM with Gleason 3 + 3 = 6 prostate cancer being managed with active surveillance who presents today for a 6 month follow up with his wife, Eugene Bennett.     Prostate cancer 71 year old white male was recently seen to establish primary care. The PSA was checked and noted to be 7.48. No prior history PSA screening. No family history of prostate cancer. He has some occasional frequency and urgency but he voids with a good stream. No gross hematuria or dysuria. No history of BPH but he does have issues with getting and maintaining an erection.  The patient underwent a prostate biopsy in April 2017 which was positive for of 12 cores for Gleason 3+3 = 6 prostate cancer. Volumes of the cores ranged from 27% to 85%.   In September 2017, the patient underwent a repeat biopsy. This showed only 1 core of Gleason 3+3 = 6 prostate cancer in the left lateral mid. It took up 22% of the core.    BPH WITH LUTS His IPSS score today is 3, which is mild lower urinary tract symptomatology. He is pleased with his quality life due to his urinary symptoms.   His major complaints today are urgency and nocturia.   He has had these symptoms for the last few years.  He denies any dysuria, hematuria or suprapubic pain.   He also denies any recent fevers, chills, nausea or vomiting.      IPSS    Row Name 05/25/16 1400         International Prostate Symptom Score   How often have you had the sensation of not emptying your bladder? Not at All     How often have you had to urinate less than every two hours? Not at All     How often have you found you stopped and started again several times when you urinated? Less than half the time     How often have you found it  difficult to postpone urination? Not at All     How often have you had a weak urinary stream? Not at All     How often have you had to strain to start urination? Not at All     How many times did you typically get up at night to urinate? 1 Time     Total IPSS Score 3       Quality of Life due to urinary symptoms   If you were to spend the rest of your life with your urinary condition just the way it is now how would you feel about that? Pleased        Score:  1-7 Mild 8-19 Moderate 20-35 Severe    PMH: Past Medical History:  Diagnosis Date  . Anemia   . Cancer ([place])   . Depression   . H/O malignant neoplasm of skin 12/12/2012  . HOH (hard of hearing)    AIDS  . Stomach ulcer     Surgical History: Past Surgical History:  Procedure Laterality Date  . CATARACT EXTRACTION W/PHACO Left 05/19/2016   Procedure: CATARACT EXTRACTION PHACO AND INTRAOCULAR LENS PLACEMENT (IOC);  Surgeon: Eulogio Bear, MD;  Location: [facility] ORS;  Service: Ophthalmology;  Laterality: Left;  [place] 46.9 AP% 16.8 CDE 8.33 Fluid pack lot # [id number]  . COLONOSCOPY WITH PROPOFOL N/A 06/08/2015   Procedure: COLONOSCOPY WITH PROPOFOL;  Surgeon: Manya Silvas, MD;  Location: [facility] ENDOSCOPY;  Service: Endoscopy;  Laterality: N/A;  . MANDIBLE SURGERY     TTETH EXTRACTIONS WITH METAL PLATE / LEFT SIDE  . PROSTATE BIOPSY    . skin cancer removal    . SKIN GRAFT  1980    Home Medications:  Allergies as of 05/25/2016   No Known Allergies     Medication List       Accurate as of 05/25/16  2:35 PM. Always use your most recent med list.          acetaminophen 500 MG tablet Commonly known as:  TYLENOL Take 1,000 mg by mouth every 6 (six) hours as needed. PRN   benzonatate 100 MG capsule Commonly known as:  TESSALON Take by mouth.   cyanocobalamin 1000 MCG/ML injection Commonly known as:  (VITAMIN B-12) Inject 1,000 mcg into the muscle every 30 (thirty) days.   cyanocobalamin 1000 MCG/ML  injection Commonly known as:  (VITAMIN B-12) INJECT 1 ML IN THE MUSCLE MONTHLY   escitalopram 10 MG tablet Commonly known as:  LEXAPRO Take 10 mg by mouth daily.   mometasone 50 MCG/ACT nasal spray Commonly known as:  NASONEX Place into the nose.   pantoprazole 40 MG tablet Commonly known as:  PROTONIX Take 40 mg by mouth daily. Reported on 06/05/2015   pantoprazole 40 MG tablet Commonly known as:  PROTONIX TAKE 1 TABLET BY MOUTH EVERY DAY   traMADol 50 MG tablet Commonly known as:  ULTRAM TAKE 1 TO 2 TABLETS BY MOUTH THREE TIMES DAILY AS NEEDED FOR PAIN       Allergies: No Known Allergies  Family History: Family History  Problem Relation Age of Onset  . Kidney cancer Neg Hx   . Bladder Cancer Neg Hx   . Prostate cancer Neg Hx     Social History:  reports that he has been smoking Cigarettes.  He has been smoking about 2.00 packs per day. He has never used smokeless tobacco. He reports that he does not drink alcohol or use drugs.  ROS: UROLOGY Frequent Urination?: No Hard to postpone urination?: Yes Burning/pain with urination?: No Get up at night to urinate?: Yes Leakage of urine?: No Urine stream starts and stops?: No Trouble starting stream?: No Do you have to strain to urinate?: No Blood in urine?: No Urinary tract infection?: No Sexually transmitted disease?: No Injury to kidneys or bladder?: No Painful intercourse?: No Weak stream?: No Erection problems?: Yes Penile pain?: No  Gastrointestinal Nausea?: No Vomiting?: No Indigestion/heartburn?: No Diarrhea?: No Constipation?: No  Constitutional Fever: No Night sweats?: No Weight loss?: No Fatigue?: No  Skin Skin rash/lesions?: No Itching?: No  Eyes Blurred vision?: Yes Double vision?: No  Ears/Nose/Throat Sore throat?: No Sinus problems?: Yes  Hematologic/Lymphatic Swollen glands?: No Easy bruising?: Yes  Cardiovascular Leg swelling?: No Chest pain?: No  Respiratory Cough?:  Yes Shortness of breath?: No  Endocrine Excessive thirst?: No  Musculoskeletal Back pain?: No Joint pain?: No  Neurological Headaches?: No Dizziness?: No  Psychologic Depression?: No Anxiety?: No  Physical Exam: BP 136/73   Pulse 80   Ht 5' 0.75" (1.543 m)   Wt 125 lb 14.4 oz (57.1 kg)   BMI 23.98 kg/m   Constitutional: Well nourished. Alert and oriented, No acute distress. HEENT: [place] AT, moist mucus  membranes. Trachea midline, no masses. Cardiovascular: No clubbing, cyanosis, or edema. Respiratory: Normal respiratory effort, no increased work of breathing. GI: Abdomen is soft, non tender, non distended, no abdominal masses. Liver and spleen not palpable.  No hernias appreciated.  Stool sample for occult testing is not indicated.   GU: No CVA tenderness.  No bladder fullness or masses.  Patient with circumcised phallus.  Urethral meatus is patent.  No penile discharge. No penile lesions or rashes. Scrotum without lesions, cysts, rashes and/or edema.  Testicles are located scrotally bilaterally. No masses are appreciated in the testicles. Left and right epididymis are normal. Rectal: Patient with  normal sphincter tone. Anus and perineum without scarring or rashes. No rectal masses are appreciated. Prostate is approximately 55 grams, irregular in the left lobe and indurated on the right, no nodules are appreciated. Seminal vesicles are normal. Skin: No rashes, bruises or suspicious lesions. Lymph: No cervical or inguinal adenopathy. Neurologic: Grossly intact, no focal deficits, moving all 4 extremities. Psychiatric: Normal mood and affect.  Laboratory Data: Lab Results  Component Value Date   WBC 7.0 10/30/2015   HGB 14.1 10/30/2015   HCT 40.2 10/30/2015   MCV 98.0 10/30/2015   PLT 160 10/30/2015    Lab Results  Component Value Date   CREATININE 0.86 10/30/2015      Assessment & Plan:   1. Prostate cancer (Gleason 3 + 3)  -  Low risk prostate cancer  -  Continue active surveillance. Repeat PSA and DRE in 6 months.(after [place])   2. BPH with LUTS  - IPSS score is 3/1  - Continue conservative management, avoiding bladder irritants and timed voiding's  - RTC in 6 months for IPSS, PSA and exam    Return in about 6 months (around 11/24/2016) for IPSS, PSA and exam.  Zara Council, [facility]  [place] [address], [place], [place] [address] [phone]

## 2016-05-25 ENCOUNTER — Ambulatory Visit: Payer: Medicare HMO | Admitting: Urology

## 2016-05-25 ENCOUNTER — Encounter: Payer: Self-pay | Admitting: Urology

## 2016-05-25 VITALS — BP 136/73 | HR 80 | Ht 60.75 in | Wt 125.9 lb

## 2016-05-25 DIAGNOSIS — N401 Enlarged prostate with lower urinary tract symptoms: Secondary | ICD-10-CM | POA: Diagnosis not present

## 2016-05-25 DIAGNOSIS — N138 Other obstructive and reflux uropathy: Secondary | ICD-10-CM

## 2016-05-25 DIAGNOSIS — C61 Malignant neoplasm of prostate: Secondary | ICD-10-CM

## 2016-05-25 LAB — PSA TOTAL (REFLEX TO FREE): PROSTATE SPECIFIC AG, SERUM: 7 ng/mL — AB (ref 0.0–4.0)

## 2016-05-25 LAB — FPSA% REFLEX
% FREE PSA: 5.3 %
PSA, FREE: 0.37 ng/mL

## 2016-05-26 ENCOUNTER — Ambulatory Visit: Payer: Commercial Managed Care - HMO

## 2016-05-26 DIAGNOSIS — Z131 Encounter for screening for diabetes mellitus: Secondary | ICD-10-CM | POA: Diagnosis not present

## 2016-05-26 DIAGNOSIS — J301 Allergic rhinitis due to pollen: Secondary | ICD-10-CM | POA: Diagnosis not present

## 2016-05-26 DIAGNOSIS — F4321 Adjustment disorder with depressed mood: Secondary | ICD-10-CM | POA: Diagnosis not present

## 2016-05-26 DIAGNOSIS — R030 Elevated blood-pressure reading, without diagnosis of hypertension: Secondary | ICD-10-CM | POA: Diagnosis not present

## 2016-05-26 DIAGNOSIS — E538 Deficiency of other specified B group vitamins: Secondary | ICD-10-CM | POA: Diagnosis not present

## 2016-05-26 DIAGNOSIS — H2511 Age-related nuclear cataract, right eye: Secondary | ICD-10-CM | POA: Diagnosis not present

## 2016-05-26 DIAGNOSIS — Z0000 Encounter for general adult medical examination without abnormal findings: Secondary | ICD-10-CM | POA: Diagnosis not present

## 2016-06-01 ENCOUNTER — Encounter: Payer: Self-pay | Admitting: *Deleted

## 2016-06-01 ENCOUNTER — Ambulatory Visit: Payer: Commercial Managed Care - HMO

## 2016-06-08 MED ORDER — ARMC OPHTHALMIC DILATING DROPS: 1.0000 "application " | OPHTHALMIC | Status: AC

## 2016-06-08 MED ORDER — MOXIFLOXACIN HCL 0.5 % OP SOLN: 1.0000 [drp] | OPHTHALMIC | Status: DC | PRN

## 2016-06-09 ENCOUNTER — Ambulatory Visit
Admission: RE | Admit: 2016-06-09 | Discharge: 2016-06-09 | Disposition: A | Discharge status: Home / Self Care | Payer: Medicare HMO | Source: Ambulatory Visit | Attending: Ophthalmology | Admitting: Ophthalmology

## 2016-06-09 ENCOUNTER — Encounter
Admission: RE | Disposition: A | Discharge status: Home / Self Care | Payer: Self-pay | Source: Ambulatory Visit | Attending: Ophthalmology

## 2016-06-09 ENCOUNTER — Encounter: Payer: Self-pay | Admitting: *Deleted

## 2016-06-09 ENCOUNTER — Ambulatory Visit: Payer: Medicare HMO | Admitting: Anesthesiology

## 2016-06-09 DIAGNOSIS — H2511 Age-related nuclear cataract, right eye: Secondary | ICD-10-CM | POA: Insufficient documentation

## 2016-06-09 DIAGNOSIS — K279 Peptic ulcer, site unspecified, unspecified as acute or chronic, without hemorrhage or perforation: Secondary | ICD-10-CM | POA: Insufficient documentation

## 2016-06-09 DIAGNOSIS — K219 Gastro-esophageal reflux disease without esophagitis: Secondary | ICD-10-CM | POA: Insufficient documentation

## 2016-06-09 DIAGNOSIS — D649 Anemia, unspecified: Secondary | ICD-10-CM | POA: Insufficient documentation

## 2016-06-09 DIAGNOSIS — F329 Major depressive disorder, single episode, unspecified: Secondary | ICD-10-CM | POA: Diagnosis not present

## 2016-06-09 DIAGNOSIS — F17200 Nicotine dependence, unspecified, uncomplicated: Secondary | ICD-10-CM | POA: Insufficient documentation

## 2016-06-09 DIAGNOSIS — F322 Major depressive disorder, single episode, severe without psychotic features: Secondary | ICD-10-CM | POA: Diagnosis not present

## 2016-06-09 HISTORY — DX: Gastro-esophageal reflux disease without esophagitis: K21.9

## 2016-06-09 HISTORY — PX: CATARACT EXTRACTION W/PHACO: SHX586

## 2016-06-09 SURGERY — PHACOEMULSIFICATION, CATARACT, WITH IOL INSERTION
Anesthesia: Monitor Anesthesia Care | Site: Eye | Laterality: Right | Wound class: Clean

## 2016-06-09 MED ORDER — ARMC OPHTHALMIC DILATING DROPS
OPHTHALMIC | Status: AC
  Filled 2016-06-09: qty 0.4

## 2016-06-09 MED ORDER — SODIUM HYALURONATE 10 MG/ML IO SOLN
INTRAOCULAR | Status: DC | PRN
  Administered 2016-06-09: 0.55 mL via INTRAOCULAR

## 2016-06-09 MED ORDER — POVIDONE-IODINE 5 % OP SOLN
OPHTHALMIC | Status: AC
  Filled 2016-06-09: qty 30

## 2016-06-09 MED ORDER — MIDAZOLAM HCL 2 MG/2ML IJ SOLN
INTRAMUSCULAR | Status: AC
  Filled 2016-06-09: qty 2

## 2016-06-09 MED ORDER — POVIDONE-IODINE 5 % OP SOLN
OPHTHALMIC | Status: DC | PRN
  Administered 2016-06-09: 1 via OPHTHALMIC

## 2016-06-09 MED ORDER — SODIUM HYALURONATE 10 MG/ML IO SOLN
INTRAOCULAR | Status: AC
  Filled 2016-06-09: qty 0.85

## 2016-06-09 MED ORDER — EPINEPHRINE PF 1 MG/ML IJ SOLN
INTRAMUSCULAR | Status: AC
  Filled 2016-06-09: qty 2

## 2016-06-09 MED ORDER — ALFENTANIL 500 MCG/ML IJ INJ
INJECTION | INTRAVENOUS | Status: DC | PRN
  Administered 2016-06-09: 250 ug via INTRAVENOUS

## 2016-06-09 MED ORDER — SODIUM CHLORIDE 0.9 % IV SOLN
INTRAVENOUS | Status: DC
  Administered 2016-06-09 (×2): via INTRAVENOUS

## 2016-06-09 MED ORDER — SODIUM HYALURONATE 23 MG/ML IO SOLN
INTRAOCULAR | Status: AC
  Filled 2016-06-09: qty 0.6

## 2016-06-09 MED ORDER — SODIUM HYALURONATE 23 MG/ML IO SOLN
INTRAOCULAR | Status: DC | PRN
  Administered 2016-06-09: 0.6 mL via INTRAOCULAR

## 2016-06-09 MED ORDER — EPINEPHRINE PF 1 MG/ML IJ SOLN
INTRAOCULAR | Status: DC | PRN
  Administered 2016-06-09: 10:00:00 via OPHTHALMIC

## 2016-06-09 MED ORDER — ARMC OPHTHALMIC DILATING DROPS
1.0000 "application " | OPHTHALMIC | Status: AC
  Administered 2016-06-09 (×3): 1 via OPHTHALMIC

## 2016-06-09 MED ORDER — MIDAZOLAM HCL 2 MG/2ML IJ SOLN
INTRAMUSCULAR | Status: DC | PRN
  Administered 2016-06-09: 0.5 mg via INTRAVENOUS

## 2016-06-09 MED ORDER — LIDOCAINE HCL (PF) 2 % IJ SOLN
INTRAMUSCULAR | Status: AC
  Filled 2016-06-09: qty 2

## 2016-06-09 MED ORDER — MOXIFLOXACIN HCL 0.5 % OP SOLN
OPHTHALMIC | Status: DC | PRN
  Administered 2016-06-09: 0.2 mL via OPHTHALMIC

## 2016-06-09 MED ORDER — MOXIFLOXACIN HCL 0.5 % OP SOLN
OPHTHALMIC | Status: AC
  Filled 2016-06-09: qty 3

## 2016-06-09 MED ORDER — BSS IO SOLN
INTRAOCULAR | Status: DC | PRN
  Administered 2016-06-09: 4 mL via OPHTHALMIC

## 2016-06-09 SURGICAL SUPPLY — 20 items
CANNULA ANT/CHMB 27GA (MISCELLANEOUS) ×6 IMPLANT
CUP MEDICINE 2OZ PLAST GRAD ST (MISCELLANEOUS) ×3 IMPLANT
DISSECTOR HYDRO NUCLEUS 50X22 (MISCELLANEOUS) ×3 IMPLANT
GLOVE BIO SURGEON STRL SZ8 (GLOVE) ×3 IMPLANT
GLOVE BIOGEL M 6.5 STRL (GLOVE) ×3 IMPLANT
GLOVE SURG LX 7.5 STRW (GLOVE) ×2
GLOVE SURG LX STRL 7.5 STRW (GLOVE) ×1 IMPLANT
GOWN STRL REUS W/ TWL LRG LVL3 (GOWN DISPOSABLE) ×2 IMPLANT
GOWN STRL REUS W/TWL LRG LVL3 (GOWN DISPOSABLE) ×4
LENS IOL TECNIS ITEC 25.5 (Intraocular Lens) ×3 IMPLANT
PACK CATARACT (MISCELLANEOUS) ×3 IMPLANT
PACK CATARACT KING (MISCELLANEOUS) ×3 IMPLANT
PACK EYE AFTER SURG (MISCELLANEOUS) ×3 IMPLANT
SOL BSS BAG (MISCELLANEOUS) ×3
SOLUTION BSS BAG (MISCELLANEOUS) ×1 IMPLANT
SYR 3ML LL SCALE MARK (SYRINGE) ×6 IMPLANT
SYR 5ML LL (SYRINGE) ×3 IMPLANT
SYR TB 1ML 27GX1/2 LL (SYRINGE) ×3 IMPLANT
WATER STERILE IRR 250ML POUR (IV SOLUTION) ×3 IMPLANT
WIPE NON LINTING 3.25X3.25 (MISCELLANEOUS) ×3 IMPLANT

## 2016-06-09 NOTE — H&P (Signed)
The History and Physical notes are on paper, have been signed, and are to be scanned.   I have examined the patient and there are no changes to the H&P.   Benay Pillow 06/09/2016 9:19 AM

## 2016-06-09 NOTE — Discharge Instructions (Signed)
Eye Surgery Discharge Instructions  Expect mild scratchy sensation or mild soreness. DO NOT RUB YOUR EYE!  The day of surgery:  Minimal physical activity, but bed rest is not required  No reading, computer work, or close hand work  No bending, lifting, or straining.  May watch TV  For 24 hours:  No driving, legal decisions, or alcoholic beverages  Safety precautions  Eat anything you prefer: It is better to start with liquids, then soup then solid foods.  _____ Eye patch should be worn until postoperative exam tomorrow.  ____ Solar shield eyeglasses should be worn for comfort in the sunlight/patch while sleeping  Resume all regular medications including aspirin or Coumadin if these were discontinued prior to surgery. You may shower, bathe, shave, or wash your hair. Tylenol may be taken for mild discomfort.  Call your doctor if you experience significant pain, nausea, or vomiting, fever > 101 or other signs of infection. [phone] or [phone] Specific instructions:  Follow-up Information    Benay Pillow, MD Follow up.   Specialty:  Ophthalmology Why:  April 27 at 10:40am Contact information: [address] [place] [address] [phone]

## 2016-06-09 NOTE — Anesthesia Preprocedure Evaluation (Addendum)
Anesthesia Evaluation  Patient identified by MRN, date of birth, ID band Patient awake    Reviewed: Allergy & Precautions, NPO status , Patient's Chart, lab work & pertinent test results, reviewed documented beta blocker date and time   Airway Mallampati: II  TM Distance: >3 FB     Dental  (+) Chipped, Upper Dentures, Lower Dentures   Pulmonary Current Smoker,           Cardiovascular      Neuro/Psych PSYCHIATRIC DISORDERS Depression    GI/Hepatic PUD, GERD  Controlled,  Endo/Other    Renal/GU      Musculoskeletal   Abdominal   Peds  Hematology  (+) anemia ,   Anesthesia Other Findings   Reproductive/Obstetrics                            Anesthesia Physical Anesthesia Plan  ASA: III  Anesthesia Plan: MAC   Post-op Pain Management:    Induction:   Airway Management Planned:   Additional Equipment:   Intra-op Plan:   Post-operative Plan:   Informed Consent: I have reviewed the patients History and Physical, chart, labs and discussed the procedure including the risks, benefits and alternatives for the proposed anesthesia with the patient or authorized representative who has indicated his/her understanding and acceptance.     Plan Discussed with: CRNA  Anesthesia Plan Comments:         Anesthesia Quick Evaluation

## 2016-06-09 NOTE — Transfer of Care (Signed)
Immediate Anesthesia Transfer of Care Note  Patient: Eugene Bennett  Procedure(s) Performed: Procedure(s) with comments: CATARACT EXTRACTION PHACO AND INTRAOCULAR LENS PLACEMENT (IOC) (Right) - [place] 54.8 AP% 13.6 CDE 4.73 Fluid Pack lot # [id number]  Patient Location: PACU and Short Stay  Anesthesia Type:MAC  Level of Consciousness: awake, alert  and patient cooperative  Airway & Oxygen Therapy: Patient Spontanous Breathing  Post-op Assessment: Report given to RN and Post -op Vital signs reviewed and stable  Post vital signs: Reviewed and stable  Last Vitals:  Vitals:   06/09/16 0758  BP: (!) 135/53  Pulse: 69  Resp: 14  Temp: 36.6 C    Last Pain:  Vitals:   06/09/16 0758  TempSrc: Oral         Complications: No apparent anesthesia complications

## 2016-06-09 NOTE — Anesthesia Postprocedure Evaluation (Signed)
Anesthesia Post Note  Patient: Eugene Bennett  Procedure(s) Performed: Procedure(s) (LRB): CATARACT EXTRACTION PHACO AND INTRAOCULAR LENS PLACEMENT (IOC) (Right)  Patient location during evaluation: PACU Anesthesia Type: MAC Level of consciousness: awake and alert Pain management: pain level controlled Vital Signs Assessment: post-procedure vital signs reviewed and stable Respiratory status: spontaneous breathing, nonlabored ventilation, respiratory function stable and patient connected to nasal cannula oxygen Cardiovascular status: stable and blood pressure returned to baseline Anesthetic complications: no     Last Vitals:  Vitals:   06/09/16 0951 06/09/16 0956  BP: (!) 136/56 (!) 136/56  Pulse: (!) 59 67  Resp: 15 18  Temp: 36.2 C     Last Pain:  Vitals:   06/09/16 0758  TempSrc: Oral                 Promise,Hyun Marsalis S

## 2016-06-09 NOTE — Anesthesia Post-op Follow-up Note (Cosign Needed)
Anesthesia QCDR form completed.        

## 2016-06-09 NOTE — Op Note (Signed)
OPERATIVE NOTE  Eugene Bennett [id number] 06/09/2016   PREOPERATIVE DIAGNOSIS:  Nuclear sclerotic cataract right eye.  H25.11   POSTOPERATIVE DIAGNOSIS:    Nuclear sclerotic cataract right eye.     PROCEDURE:  Phacoemusification with posterior chamber intraocular lens placement of the right eye   LENS:   Implant Name Type Inv. Item Serial No. Manufacturer Lot No. LRB No. Used  LENS IOL DIOP 25.5 - [id number] Intraocular Lens LENS IOL DIOP 25.5 [id number] AMO   Right 1       PCB00 +25.5   ULTRASOUND TIME: 0 minutes 54.8 seconds.  CDE 4.73   SURGEON:  Benay Pillow, MD, MPH  ANESTHESIOLOGIST: Anesthesiologist: Gunnar Bulla, MD CRNA: Courtney Paris, CRNA   ANESTHESIA:  Topical with tetracaine drops augmented with 1% preservative-free intracameral lidocaine.  ESTIMATED BLOOD LOSS: less than 1 mL.   COMPLICATIONS:  None.   DESCRIPTION OF PROCEDURE:  The patient was identified in the holding room and transported to the operating room and placed in the supine position under the operating microscope.  The right eye was identified as the operative eye and it was prepped and draped in the usual sterile ophthalmic fashion.   A 1.0 millimeter clear-corneal paracentesis was made at the 10:30 position. 0.5 ml of preservative-free 1% lidocaine with epinephrine was injected into the anterior chamber.  The anterior chamber was filled with Healon 5 viscoelastic.  A 2.4 millimeter keratome was used to make a near-clear corneal incision at the 8:00 position.  A curvilinear capsulorrhexis was made with a cystotome and capsulorrhexis forceps.  Balanced salt solution was used to hydrodissect and hydrodelineate the nucleus.   Phacoemulsification was then used in stop and chop fashion to remove the lens nucleus and epinucleus.  The remaining cortex was then removed using the irrigation and aspiration handpiece.  There was some chemosis superiorly.   Healon was then placed into the capsular bag to  distend it for lens placement.  A lens was then injected into the capsular bag.  The remaining viscoelastic was aspirated.   Wounds were hydrated with balanced salt solution.  The anterior chamber was inflated to a physiologic pressure with balanced salt solution.   Intracameral vigamox 0.1 mL undiluted was injected into the eye and a drop placed onto the ocular surface.  No wound leaks were noted.  The patient was taken to the recovery room in stable condition without complications of anesthesia or surgery  Benay Pillow 06/09/2016, 9:53 AM

## 2016-11-16 DIAGNOSIS — R030 Elevated blood-pressure reading, without diagnosis of hypertension: Secondary | ICD-10-CM | POA: Diagnosis not present

## 2016-11-16 DIAGNOSIS — F4321 Adjustment disorder with depressed mood: Secondary | ICD-10-CM | POA: Diagnosis not present

## 2016-11-16 DIAGNOSIS — Z131 Encounter for screening for diabetes mellitus: Secondary | ICD-10-CM | POA: Diagnosis not present

## 2016-11-23 ENCOUNTER — Other Ambulatory Visit: Payer: Medicare HMO

## 2016-11-23 ENCOUNTER — Other Ambulatory Visit: Payer: Self-pay

## 2016-11-23 DIAGNOSIS — K2990 Gastroduodenitis, unspecified, without bleeding: Secondary | ICD-10-CM | POA: Diagnosis not present

## 2016-11-23 DIAGNOSIS — N401 Enlarged prostate with lower urinary tract symptoms: Secondary | ICD-10-CM

## 2016-11-23 DIAGNOSIS — E538 Deficiency of other specified B group vitamins: Secondary | ICD-10-CM | POA: Diagnosis not present

## 2016-11-23 DIAGNOSIS — Z23 Encounter for immunization: Secondary | ICD-10-CM | POA: Diagnosis not present

## 2016-11-23 DIAGNOSIS — F325 Major depressive disorder, single episode, in full remission: Secondary | ICD-10-CM | POA: Diagnosis not present

## 2016-11-23 DIAGNOSIS — J208 Acute bronchitis due to other specified organisms: Secondary | ICD-10-CM | POA: Diagnosis not present

## 2016-11-23 DIAGNOSIS — B9689 Other specified bacterial agents as the cause of diseases classified elsewhere: Secondary | ICD-10-CM | POA: Diagnosis not present

## 2016-11-23 DIAGNOSIS — K2970 Gastritis, unspecified, without bleeding: Secondary | ICD-10-CM | POA: Diagnosis not present

## 2016-11-23 NOTE — Progress Notes (Signed)
11/24/2016 8:53 AM   Eugene Bennett Jun 28, 1945 [id number]  Referring provider: Glendon Axe, MD [facility] [place], [place] [address]  Chief Complaint  Patient presents with  . Prostate Cancer    6 month follow up  . Benign Prostatic Hypertrophy    HPI: 71 yo WM with Gleason 3 + 3 = 6 prostate cancer being managed with active surveillance who presents today for a 6 month follow up with his wife, Eugene Bennett.     Prostate cancer 71 year old white male was recently seen to establish primary care. The PSA was checked and noted to be 7.48. No prior history PSA screening. No family history of prostate cancer. He has some occasional frequency and urgency, but he voids with a good stream. No gross hematuria or dysuria. No history of BPH but he does have issues with getting and maintaining an erection.  The patient underwent a prostate biopsy in April 2017 which was positive for of 12 cores for Gleason 3+3 = 6 prostate cancer. Volumes of the cores ranged from 27% to 85%.   In September 2017, the patient underwent a repeat biopsy. This showed only 1 core of Gleason 3+3 = 6 prostate cancer in the left lateral mid. It took up 22% of the core.  His current PSA is 8.1.    BPH WITH LUTS His IPSS score today is 6, which is mild lower urinary tract symptomatology. He is delighted with his quality life due to his urinary symptoms.   His previous I PSS score was 3/1.  His major complaints today are urgency and nocturia.   He has had these symptoms for the last few years.  He denies any dysuria, hematuria or suprapubic pain.   He also denies any recent fevers, chills, nausea or vomiting.      IPSS    Row Name 11/24/16 0800         International Prostate Symptom Score   How often have you had the sensation of not emptying your bladder? Not at All     How often have you had to urinate less than every two hours? Almost always     How often have you found you stopped and started  again several times when you urinated? Not at All     How often have you found it difficult to postpone urination? Not at All     How often have you had a weak urinary stream? Not at All     How often have you had to strain to start urination? Not at All     How many times did you typically get up at night to urinate? 1 Time     Total IPSS Score 6       Quality of Life due to urinary symptoms   If you were to spend the rest of your life with your urinary condition just the way it is now how would you feel about that? Delighted        Score:  1-7 Mild 8-19 Moderate 20-35 Severe    PMH: Past Medical History:  Diagnosis Date  . Anemia   . Cancer ([place])    PROSTATE  . Depression   . GERD (gastroesophageal reflux disease)   . H/O malignant neoplasm of skin 12/12/2012  . HOH (hard of hearing)    AIDS  . Stomach ulcer     Surgical History: Past Surgical History:  Procedure Laterality Date  . CATARACT EXTRACTION W/PHACO Left  05/19/2016   Procedure: CATARACT EXTRACTION PHACO AND INTRAOCULAR LENS PLACEMENT (IOC);  Surgeon: Eulogio Bear, MD;  Location: [facility] ORS;  Service: Ophthalmology;  Laterality: Left;  [place] 46.9 AP% 16.8 CDE 8.33 Fluid pack lot # [id number]  . CATARACT EXTRACTION W/PHACO Right 06/09/2016   Procedure: CATARACT EXTRACTION PHACO AND INTRAOCULAR LENS PLACEMENT (IOC);  Surgeon: Eulogio Bear, MD;  Location: [facility] ORS;  Service: Ophthalmology;  Laterality: Right;  [place] 54.8 AP% 13.6 CDE 4.73 Fluid Pack lot # [id number] H  . COLONOSCOPY WITH PROPOFOL N/A 06/08/2015   Procedure: COLONOSCOPY WITH PROPOFOL;  Surgeon: Manya Silvas, MD;  Location: [facility] END[facility]COPY;  Service: Endoscopy;  Laterality: N/A;  . MANDIBLE SURGERY     TTETH EXTRACTIONS WITH METAL PLATE / LEFT SIDE  . PROSTATE BIOPSY    . skin cancer removal    . SKIN GRAFT  1980    Home Medications:  Allergies as of 11/24/2016   No Known Allergies     Medication List       Accurate as of 11/24/16  8:53  AM. Always use your most recent med list.          acetaminophen 500 MG tablet Commonly known as:  TYLENOL Take 1,000 mg by mouth 2 (two) times daily as needed for headache. PRN   amoxicillin-clavulanate 875-125 MG tablet Commonly known as:  AUGMENTIN Take by mouth.   benzonatate 100 MG capsule Commonly known as:  TESSALON Take 100 mg by mouth daily as needed for cough.   cyanocobalamin 1000 MCG/ML injection Commonly known as:  (VITAMIN B-12) INJECT 1 ML IN THE MUSCLE MONTHLY ON THE 8TH OF THE MONTH   DUREZOL 0.05 % Emul Generic drug:  Difluprednate Place 1 drop into the left eye 2 (two) times daily.   escitalopram 10 MG tablet Commonly known as:  LEXAPRO Take 10 mg by mouth daily.   flunisolide 25 MCG/ACT (0.025%) Soln Commonly known as:  NASALIDE Place into the nose.   FOLIC ACID PO Take by mouth 3 (three) times daily.   ibuprofen 200 MG tablet Commonly known as:  ADVIL,MOTRIN Take 400 mg by mouth 2 (two) times daily as needed for headache.   mometasone 50 MCG/ACT nasal spray Commonly known as:  NASONEX Place into the nose.   multivitamin with minerals Tabs tablet Take 1 tablet by mouth daily.   OVER THE COUNTER MEDICATION Take 2 tablets by mouth 2 (two) times daily as needed (allergies). Coricidin Allergy   pantoprazole 40 MG tablet Commonly known as:  PROTONIX Take 40 mg by mouth daily. Reported on 06/05/2015   pantoprazole 40 MG tablet Commonly known as:  PROTONIX TAKE 1 TABLET BY MOUTH EVERY DAY   traMADol 50 MG tablet Commonly known as:  ULTRAM TAKE 1 TABLET BY MOUTH DAILY AS NEEDED FOR PAIN       Allergies: No Known Allergies  Family History: Family History  Problem Relation Age of Onset  . Stomach cancer Paternal Grandfather   . Kidney cancer Neg Hx   . Bladder Cancer Neg Hx   . Prostate cancer Neg Hx     Social History:  reports that he has been smoking Cigarettes.  He has been smoking about 2.00 packs per day. He has never used  smokeless tobacco. He reports that he does not drink alcohol or use drugs.  ROS: UROLOGY Frequent Urination?: No Hard to postpone urination?: No Burning/pain with urination?: No Get up at night to urinate?: Yes Leakage of urine?: No Urine stream  starts and stops?: No Trouble starting stream?: No Do you have to strain to urinate?: No Blood in urine?: No Urinary tract infection?: No Sexually transmitted disease?: No Injury to kidneys or bladder?: No Painful intercourse?: No Weak stream?: No Erection problems?: Yes Penile pain?: No  Gastrointestinal Nausea?: No Vomiting?: No Indigestion/heartburn?: No Diarrhea?: No Constipation?: No  Constitutional Fever: No Night sweats?: No Weight loss?: No Fatigue?: Yes  Skin Skin rash/lesions?: No Itching?: No  Eyes Blurred vision?: No Double vision?: No  Ears/Nose/Throat Sore throat?: No Sinus problems?: Yes  Hematologic/Lymphatic Swollen glands?: No Easy bruising?: Yes  Cardiovascular Leg swelling?: No Chest pain?: No  Respiratory Cough?: Yes Shortness of breath?: No  Endocrine Excessive thirst?: No  Musculoskeletal Back pain?: Yes Joint pain?: Yes  Neurological Headaches?: Yes Dizziness?: No  Psychologic Depression?: Yes Anxiety?: No  Physical Exam: BP (!) 144/71   Pulse 74   Ht 5' (1.524 m)   Wt 126 lb 12.8 oz (57.5 kg)   BMI 24.76 kg/m   Constitutional: Well nourished. Alert and oriented, No acute distress. HEENT: [place] AT, moist mucus membranes. Trachea midline, no masses. Cardiovascular: No clubbing, cyanosis, or edema. Respiratory: Normal respiratory effort, no increased work of breathing. GI: Abdomen is soft, non tender, non distended, no abdominal masses. Liver and spleen not palpable.  No hernias appreciated.  Stool sample for occult testing is not indicated.   GU: No CVA tenderness.  No bladder fullness or masses.  Patient with circumcised phallus.  Urethral meatus is patent.  No penile  discharge. No penile lesions or rashes. Scrotum without lesions, cysts, rashes and/or edema.  Testicles are located scrotally bilaterally. No masses are appreciated in the testicles. Left and right epididymis are normal. Rectal: Patient with  normal sphincter tone. Anus and perineum without scarring or rashes. No rectal masses are appreciated. Prostate is approximately 55 grams, irregular in the left lobe and indurated on the right, no nodules are appreciated. Seminal vesicles are normal. Skin: No rashes, bruises or suspicious lesions. Lymph: No cervical or inguinal adenopathy. Neurologic: Grossly intact, no focal deficits, moving all 4 extremities. Psychiatric: Normal mood and affect.  Laboratory Data: PSA History  7.9 ng/mL on 04/08/2015  bx - Gleason's 6  On 05/18/2015  bx- Gleason's 6 on 10/22/2015  7.0 ng/mL on 05/24/2016  8.1 ng/mL on 11/23/2016     Assessment & Plan:   1. Prostate cancer (Gleason 3 + 3)  - Low risk prostate cancer  - PSA increasing - offered rechecking the PSA in 6 months or MRI of prostate - patient is not interested in pursuing any treatments at this time so we will recheck in 6 months   2. BPH with LUTS  - IPSS score is 6/0, it is worsening  - Continue conservative management, avoiding bladder irritants and timed voiding's  - RTC in 6 months for IPSS, PSA and exam    Return in about 6 months (around 05/25/2017) for IPSS, PSA and exam.  Zara Council, [facility]  [place] [address] [place], [place] [address] [phone]

## 2016-11-24 ENCOUNTER — Encounter: Payer: Self-pay | Admitting: Urology

## 2016-11-24 ENCOUNTER — Ambulatory Visit: Payer: Medicare HMO | Admitting: Urology

## 2016-11-24 VITALS — BP 144/71 | HR 74 | Ht 60.0 in | Wt 126.8 lb

## 2016-11-24 DIAGNOSIS — N401 Enlarged prostate with lower urinary tract symptoms: Secondary | ICD-10-CM | POA: Diagnosis not present

## 2016-11-24 DIAGNOSIS — C61 Malignant neoplasm of prostate: Secondary | ICD-10-CM | POA: Diagnosis not present

## 2016-11-24 DIAGNOSIS — N138 Other obstructive and reflux uropathy: Secondary | ICD-10-CM | POA: Diagnosis not present

## 2016-11-24 LAB — PSA: Prostate Specific Ag, Serum: 8.1 ng/mL — ABNORMAL HIGH (ref 0.0–4.0)

## 2017-05-22 ENCOUNTER — Other Ambulatory Visit: Payer: Medicare HMO

## 2017-05-22 ENCOUNTER — Other Ambulatory Visit: Payer: Self-pay | Admitting: Family Medicine

## 2017-05-22 DIAGNOSIS — C61 Malignant neoplasm of prostate: Secondary | ICD-10-CM | POA: Diagnosis not present

## 2017-05-23 LAB — PSA: PROSTATE SPECIFIC AG, SERUM: 9 ng/mL — AB (ref 0.0–4.0)

## 2017-05-23 NOTE — Progress Notes (Signed)
05/24/2017 8:56 AM   Eugene Bennett 1945/06/02 [id number]  Referring provider: Glendon Axe, MD [facility] [place], [place] [address]  Chief Complaint  Patient presents with  . Prostate Cancer    HPI: 72 yo WM with Gleason 3 + 3 = 6 prostate cancer being managed with active surveillance who presents today for a 6 month follow up with his wife, Eugene Bennett.     Prostate cancer 72 year old white male was recently seen to establish primary care. The PSA was checked and noted to be 7.48. No prior history PSA screening. No family history of prostate cancer. He has some occasional frequency and urgency, but he voids with a good stream. No gross hematuria or dysuria. No history of BPH but he does have issues with getting and maintaining an erection.  The patient underwent a prostate biopsy in April 2017 which was positive for of 12 cores for Gleason 3+3 = 6 prostate cancer. Volumes of the cores ranged from 27% to 85%.   In September 2017, the patient underwent a repeat biopsy. This showed only 1 core of Gleason 3+3 = 6 prostate cancer in the left lateral mid. It took up 22% of the core.  His current PSA is 9.0.     BPH WITH LUTS His IPSS score today is 3, which is mild lower urinary tract symptomatology. He is delighted with his quality life due to his urinary symptoms.   His previous I PSS score was 6/0.  His major complaints today are urgency and nocturia.   He has had these symptoms for the last few years.  He denies any dysuria, hematuria or suprapubic pain.   He also denies any recent fevers, chills, nausea or vomiting.  IPSS    Row Name 05/24/17 0800         International Prostate Symptom Score   How often have you had the sensation of not emptying your bladder?  Not at All     How often have you had to urinate less than every two hours?  Less than 1 in 5 times     How often have you found you stopped and started again several times when you urinated?  Not at  All     How often have you found it difficult to postpone urination?  Not at All     How often have you had a weak urinary stream?  Not at All     How often have you had to strain to start urination?  Not at All     How many times did you typically get up at night to urinate?  2 Times     Total IPSS Score  3       Quality of Life due to urinary symptoms   If you were to spend the rest of your life with your urinary condition just the way it is now how would you feel about that?  Delighted        Score:  1-7 Mild 8-19 Moderate 20-35 Severe    PMH: Past Medical History:  Diagnosis Date  . Anemia   . Cancer ([place])    PROSTATE  . Depression   . GERD (gastroesophageal reflux disease)   . H/O malignant neoplasm of skin 12/12/2012  . HOH (hard of hearing)    AIDS  . Stomach ulcer     Surgical History: Past Surgical History:  Procedure Laterality Date  . CATARACT EXTRACTION W/PHACO Left 05/19/2016  Procedure: CATARACT EXTRACTION PHACO AND INTRAOCULAR LENS PLACEMENT (IOC);  Surgeon: Eulogio Bear, MD;  Location: [facility] ORS;  Service: Ophthalmology;  Laterality: Left;  [place] 46.9 AP% 16.8 CDE 8.33 Fluid pack lot # [id number] H  . CATARACT EXTRACTION W/PHACO Right 06/09/2016   Procedure: CATARACT EXTRACTION PHACO AND INTRAOCULAR LENS PLACEMENT (IOC);  Surgeon: Eulogio Bear, MD;  Location: [facility] ORS;  Service: Ophthalmology;  Laterality: Right;  [place] 54.8 AP% 13.6 CDE 4.73 Fluid Pack lot # [id number] H  . COLONOSCOPY WITH PROPOFOL N/A 06/08/2015   Procedure: COLONOSCOPY WITH PROPOFOL;  Surgeon: Manya Silvas, MD;  Location: [facility] ENDOSCOPY;  Service: Endoscopy;  Laterality: N/A;  . MANDIBLE SURGERY     TTETH EXTRACTIONS WITH METAL PLATE / LEFT SIDE  . PROSTATE BIOPSY    . skin cancer removal    . SKIN GRAFT  1980    Home Medications:  Allergies as of 05/24/2017   No Known Allergies     Medication List        Accurate as of 05/24/17  8:56 AM. Always use your most recent med  list.          acetaminophen 500 MG tablet Commonly known as:  TYLENOL Take 1,000 mg by mouth 2 (two) times daily as needed for headache. PRN   benzonatate 100 MG capsule Commonly known as:  TESSALON Take 100 mg by mouth daily as needed for cough.   cyanocobalamin 1000 MCG/ML injection Commonly known as:  (VITAMIN B-12) INJECT 1 ML IN THE MUSCLE MONTHLY ON THE 8TH OF THE MONTH   escitalopram 10 MG tablet Commonly known as:  LEXAPRO Take 10 mg by mouth daily.   FOLIC ACID PO Take by mouth 3 (three) times daily.   ibuprofen 200 MG tablet Commonly known as:  ADVIL,MOTRIN Take 400 mg by mouth 2 (two) times daily as needed for headache.   multivitamin with minerals Tabs tablet Take 1 tablet by mouth daily.   OVER THE COUNTER MEDICATION Take 2 tablets by mouth 2 (two) times daily as needed (allergies). Coricidin Allergy   traMADol 50 MG tablet Commonly known as:  ULTRAM TAKE 1 TABLET BY MOUTH DAILY AS NEEDED FOR PAIN       Allergies: No Known Allergies  Family History: Family History  Problem Relation Age of Onset  . Stomach cancer Paternal Grandfather   . Kidney cancer Neg Hx   . Bladder Cancer Neg Hx   . Prostate cancer Neg Hx     Social History:  reports that he has been smoking cigarettes.  He has been smoking about 2.00 packs per day. He has never used smokeless tobacco. He reports that he does not drink alcohol or use drugs.  ROS: UROLOGY Frequent Urination?: No Hard to postpone urination?: No Burning/pain with urination?: No Get up at night to urinate?: Yes Leakage of urine?: No Urine stream starts and stops?: No Trouble starting stream?: No Do you have to strain to urinate?: No Blood in urine?: No Urinary tract infection?: No Sexually transmitted disease?: No Injury to kidneys or bladder?: No Painful intercourse?: No Weak stream?: No Erection problems?: Yes Penile pain?: No  Gastrointestinal Nausea?: No Vomiting?:  No Indigestion/heartburn?: Yes Diarrhea?: No Constipation?: No  Constitutional Fever: No Night sweats?: No Weight loss?: No Fatigue?: No  Skin Skin rash/lesions?: No Itching?: No  Eyes Blurred vision?: No Double vision?: No  Ears/Nose/Throat Sore throat?: No Sinus problems?: Yes  Hematologic/Lymphatic Swollen glands?: No Easy bruising?: Yes  Cardiovascular Leg swelling?: No Chest  pain?: No  Respiratory Cough?: No Shortness of breath?: No  Endocrine Excessive thirst?: No  Musculoskeletal Back pain?: No Joint pain?: Yes  Neurological Headaches?: No Dizziness?: No  Psychologic Depression?: No Anxiety?: No  Physical Exam: BP 123/74 (BP Location: Right Arm, Patient Position: Sitting, Cuff Size: Normal)   Pulse 80   Ht 5' (1.524 m)   Wt 124 lb (56.2 kg)   BMI 24.22 kg/m   Constitutional: Well nourished. Alert and oriented, No acute distress. HEENT: [place] AT, moist mucus membranes. Trachea midline, no masses. Cardiovascular: No clubbing, cyanosis, or edema. Respiratory: Normal respiratory effort, no increased work of breathing. GI: Abdomen is soft, non tender, non distended, no abdominal masses. Liver and spleen not palpable.  No hernias appreciated.  Stool sample for occult testing is not indicated.   GU: No CVA tenderness.  No bladder fullness or masses.  Patient with circumcised phallus.  Urethral meatus is patent.  No penile discharge. No penile lesions or rashes. Scrotum without lesions, cysts, rashes and/or edema.  Testicles are located scrotally bilaterally. No masses are appreciated in the testicles. Left and right epididymis are normal. Rectal: Patient with  normal sphincter tone. Anus and perineum without scarring or rashes. No rectal masses are appreciated. Prostate is approximately 55 grams, irregular in the left lobe and indurated on the right, no nodules are appreciated.  Seminal vesicles are normal. Skin: No rashes, bruises or suspicious  lesions. Lymph: No cervical or inguinal adenopathy. Neurologic: Grossly intact, no focal deficits, moving all 4 extremities. Psychiatric: Normal mood and affect.   Laboratory Data: PSA History  7.9 ng/mL on 04/08/2015  bx - Gleason's 6  On 05/18/2015  bx- Gleason's 6 on 10/22/2015  7.0 ng/mL on 05/24/2016  8.1 ng/mL on 11/23/2016  9.0 ng/mL on 05/22/2017   Assessment & Plan:   1. Prostate cancer (Gleason 3 + 3)  - Low risk prostate cancer  - PSA continues to increased - explained to the patient that this may represent a worsening pathology of the cancer - discussed  rechecking the PSA in 3 months, MRI of prostate or another biopsy - and the risks of the spread of prostate cancer to the bones causing pathological fractures if an aggressive cancer is missed - he understands the risks and is not interested in pursuing any treatments at this time so we will recheck in 3 months   2. BPH with LUTS  - IPSS score is 3/0, it is improved  - Continue conservative management, avoiding bladder irritants and timed voiding's  - RTC in 3 months for IPSS, PSA and exam    Return in about 3 months (around 08/23/2017) for IPSS, PSA and exam.  Zara Council, [facility]  [place] [address], [place], [place] [address] [phone]

## 2017-05-24 ENCOUNTER — Encounter: Payer: Self-pay | Admitting: Urology

## 2017-05-24 ENCOUNTER — Ambulatory Visit: Payer: Medicare HMO | Admitting: Urology

## 2017-05-24 VITALS — BP 123/74 | HR 80 | Ht 60.0 in | Wt 124.0 lb

## 2017-05-24 DIAGNOSIS — N138 Other obstructive and reflux uropathy: Secondary | ICD-10-CM

## 2017-05-24 DIAGNOSIS — C61 Malignant neoplasm of prostate: Secondary | ICD-10-CM | POA: Diagnosis not present

## 2017-05-24 DIAGNOSIS — N401 Enlarged prostate with lower urinary tract symptoms: Secondary | ICD-10-CM | POA: Diagnosis not present

## 2017-08-29 ENCOUNTER — Other Ambulatory Visit: Payer: Medicare HMO

## 2017-08-29 DIAGNOSIS — N401 Enlarged prostate with lower urinary tract symptoms: Secondary | ICD-10-CM

## 2017-08-29 DIAGNOSIS — C61 Malignant neoplasm of prostate: Secondary | ICD-10-CM | POA: Diagnosis not present

## 2017-08-29 DIAGNOSIS — N138 Other obstructive and reflux uropathy: Secondary | ICD-10-CM

## 2017-08-29 NOTE — Progress Notes (Signed)
08/30/2017 9:13 AM   Eugene Bennett 05-Oct-1945 [id number]  Referring provider: Glendon Axe, MD [facility] [place], [place] [address]  Chief Complaint  Patient presents with  . Prostate Cancer    HPI: 72 yo WM with Gleason 3 + 3 = 6 prostate cancer being managed with active surveillance who presents today for a 3 month follow up with his wife, Eugene Bennett.     Prostate cancer 72 year old white male was recently seen to establish primary care. The PSA was checked and noted to be 7.48. No prior history PSA screening. No family history of prostate cancer. He has some occasional frequency and urgency, but he voids with a good stream. No gross hematuria or dysuria. No history of BPH but he does have issues with getting and maintaining an erection.  The patient underwent a prostate biopsy in April 2017 which was positive for of 12 cores for Gleason 3+3 = 6 prostate cancer. Volumes of the cores ranged from 27% to 85%.   In September 2017, the patient underwent a repeat biopsy. This showed only 1 core of Gleason 3+3 = 6 prostate cancer in the left lateral mid. It took up 22% of the core.  His current PSA is 9.3.     BPH WITH LUTS His IPSS score today is 3, which is mild lower urinary tract symptomatology.  He is delighted with his quality life due to his urinary symptoms.   His previous I PSS score was 3/0.  His major complaints today are nocturia and incontinence.  He has had these symptoms for the last few years.  He denies any dysuria, hematuria or suprapubic pain.   He also denies any recent fevers, chills, nausea or vomiting. IPSS    Row Name 08/30/17 0800         International Prostate Symptom Score   How often have you had the sensation of not emptying your bladder?  Not at All     How often have you had to urinate less than every two hours?  Not at All     How often have you found you stopped and started again several times when you urinated?  Less than 1 in 5  times     How often have you found it difficult to postpone urination?  Not at All     How often have you had a weak urinary stream?  Less than 1 in 5 times     How often have you had to strain to start urination?  Not at All     How many times did you typically get up at night to urinate?  1 Time     Total IPSS Score  3       Quality of Life due to urinary symptoms   If you were to spend the rest of your life with your urinary condition just the way it is now how would you feel about that?  Delighted        Score:  1-7 Mild 8-19 Moderate 20-35 Severe    PMH: Past Medical History:  Diagnosis Date  . Anemia   . Cancer ([place])    PROSTATE  . Depression   . GERD (gastroesophageal reflux disease)   . H/O malignant neoplasm of skin 12/12/2012  . HOH (hard of hearing)    AIDS  . Stomach ulcer     Surgical History: Past Surgical History:  Procedure Laterality Date  . CATARACT EXTRACTION W/PHACO Left  05/19/2016   Procedure: CATARACT EXTRACTION PHACO AND INTRAOCULAR LENS PLACEMENT (IOC);  Surgeon: Eulogio Bear, MD;  Location: [facility] ORS;  Service: Ophthalmology;  Laterality: Left;  [place] 46.9 AP% 16.8 CDE 8.33 Fluid pack lot # [id number]  . CATARACT EXTRACTION W/PHACO Right 06/09/2016   Procedure: CATARACT EXTRACTION PHACO AND INTRAOCULAR LENS PLACEMENT (IOC);  Surgeon: Eulogio Bear, MD;  Location: [facility] ORS;  Service: Ophthalmology;  Laterality: Right;  [place] 54.8 AP% 13.6 CDE 4.73 Fluid Pack lot # [id number]  . COLONOSCOPY WITH PROPOFOL N/A 06/08/2015   Procedure: COLONOSCOPY WITH PROPOFOL;  Surgeon: Manya Silvas, MD;  Location: [facility] ENDOSCOPY;  Service: Endoscopy;  Laterality: N/A;  . MANDIBLE SURGERY     TTETH EXTRACTIONS WITH METAL PLATE / LEFT SIDE  . PROSTATE BIOPSY    . skin cancer removal    . SKIN GRAFT  1980    Home Medications:  Allergies as of 08/30/2017   No Known Allergies     Medication List        Accurate as of 08/30/17  9:13 AM. Always use your most  recent med list.          acetaminophen 500 MG tablet Commonly known as:  TYLENOL Take 1,000 mg by mouth 2 (two) times daily as needed for headache. PRN   benzonatate 100 MG capsule Commonly known as:  TESSALON Take 100 mg by mouth daily as needed for cough.   cyanocobalamin 1000 MCG/ML injection Commonly known as:  (VITAMIN B-12) INJECT 1 ML IN THE MUSCLE MONTHLY ON THE 8TH OF THE MONTH   escitalopram 10 MG tablet Commonly known as:  LEXAPRO Take 10 mg by mouth daily.   FOLIC ACID PO Take by mouth 3 (three) times daily.   ibuprofen 200 MG tablet Commonly known as:  ADVIL,MOTRIN Take 400 mg by mouth 2 (two) times daily as needed for headache.   multivitamin with minerals Tabs tablet Take 1 tablet by mouth daily.   OVER THE COUNTER MEDICATION Take 2 tablets by mouth 2 (two) times daily as needed (allergies). Coricidin Allergy   pantoprazole 40 MG tablet Commonly known as:  PROTONIX TK 1 T PO  QD   traMADol 50 MG tablet Commonly known as:  ULTRAM TAKE 1 TABLET BY MOUTH DAILY AS NEEDED FOR PAIN   VANISHPOINT TUBERCULIN SYRINGE 25G X 5/8" 1 ML Misc Generic drug:  TUBERCULIN SYR 1CC/25GX5/8" U ONCE A MONTH WITH B12 INJECTIONS       Allergies: No Known Allergies  Family History: Family History  Problem Relation Age of Onset  . Stomach cancer Paternal Grandfather   . Kidney cancer Neg Hx   . Bladder Cancer Neg Hx   . Prostate cancer Neg Hx     Social History:  reports that he has been smoking cigarettes.  He has been smoking about 2.00 packs per day. He has never used smokeless tobacco. He reports that he does not drink alcohol or use drugs.  ROS: UROLOGY Frequent Urination?: No Hard to postpone urination?: No Burning/pain with urination?: No Get up at night to urinate?: Yes Leakage of urine?: Yes Urine stream starts and stops?: No Trouble starting stream?: No Do you have to strain to urinate?: No Blood in urine?: No Urinary tract infection?:  No Sexually transmitted disease?: No Injury to kidneys or bladder?: No Painful intercourse?: No Weak stream?: No Erection problems?: Yes Penile pain?: No  Gastrointestinal Nausea?: No Vomiting?: No Indigestion/heartburn?: No Diarrhea?: No Constipation?: No  Constitutional Fever: No Night  sweats?: No Weight loss?: No Fatigue?: Yes  Skin Skin rash/lesions?: No Itching?: No  Eyes Blurred vision?: No Double vision?: No  Ears/Nose/Throat Sore throat?: No Sinus problems?: No  Hematologic/Lymphatic Swollen glands?: No Easy bruising?: Yes  Cardiovascular Leg swelling?: No Chest pain?: No  Respiratory Cough?: No Shortness of breath?: No  Endocrine Excessive thirst?: No  Musculoskeletal Back pain?: No Joint pain?: No  Neurological Headaches?: No Dizziness?: No  Psychologic Depression?: No Anxiety?: No  Physical Exam: BP 134/71   Pulse 61   Ht 5' (1.524 m)   Wt 128 lb (58.1 kg)   BMI 25.00 kg/m   Constitutional: Well nourished. Alert and oriented, No acute distress. HEENT: [place] AT, moist mucus membranes. Trachea midline, no masses. Cardiovascular: No clubbing, cyanosis, or edema. Respiratory: Normal respiratory effort, no increased work of breathing. GI: Abdomen is soft, non tender, non distended, no abdominal masses. Liver and spleen not palpable.  No hernias appreciated.  Stool sample for occult testing is not indicated.   GU: No CVA tenderness.  No bladder fullness or masses.  Patient with circumcised phallus.  Urethral meatus is patent.  No penile discharge. No penile lesions or rashes. Scrotum without lesions, cysts, rashes and/or edema.  Testicles are located scrotally bilaterally. No masses are appreciated in the testicles. Left and right epididymis are normal. Rectal: Patient with  normal sphincter tone. Anus and perineum without scarring or rashes. No rectal masses are appreciated. Prostate is approximately 50 grams and nodular.  Seminal vesicles  are normal. Skin: No rashes, bruises or suspicious lesions. Lymph: No cervical or inguinal adenopathy. Neurologic: Grossly intact, no focal deficits, moving all 4 extremities. Psychiatric: Normal mood and affect.    Laboratory Data: PSA History  7.9 ng/mL on 04/08/2015  bx - Gleason's 6  On 05/18/2015  bx- Gleason's 6 on 10/22/2015  7.0 ng/mL on 05/24/2016  8.1 ng/mL on 11/23/2016  9.0 ng/mL on 05/22/2017  9.3 ng/mL on 08/29/2017   I have reviewed the labs.  Assessment & Plan:   1. Prostate cancer (Gleason 3 + 3) Low risk prostate cancer PSA continues to increased - explained to the patient that this may represent a worsening pathology of the cancer and I strongly encouraged a biopsy at this time especially since the prostate exam has changed, he had a "bad experience" with his last biopsy and had to visit the ED -reviewing the ED notes - his UA was suspicious for infection, but urine culture and blood cultures were negative and WBC count was normal - he was treated Keflex po as an outpatient Patient will be schedule for a TRUSPBx of prostate.  The procedure is explained and the risks involved, such as blood in urine, blood in stool, blood in semen, infection, urinary retention, and on rare occasions sepsis and death.  Patient understands the risks as explained to him and he wishes to proceed.    2. Rising PSA See above  3. Abnormal prostate exam See above  4. BPH with LUTS  - IPSS score is 3/0, it is stable  - Continue conservative management, avoiding bladder irritants and timed voiding's  - RTC in 3 months for IPSS, PSA and exam    Return for TRUSPBx of prostate.  Zara Council, PA-C  [facility] [address] [place], [place] [address] [phone]

## 2017-08-30 ENCOUNTER — Encounter: Payer: Self-pay | Admitting: Urology

## 2017-08-30 ENCOUNTER — Ambulatory Visit: Payer: Medicare HMO | Admitting: Urology

## 2017-08-30 ENCOUNTER — Other Ambulatory Visit: Payer: Self-pay

## 2017-08-30 VITALS — BP 134/71 | HR 61 | Ht 60.0 in | Wt 128.0 lb

## 2017-08-30 DIAGNOSIS — N138 Other obstructive and reflux uropathy: Secondary | ICD-10-CM

## 2017-08-30 DIAGNOSIS — C61 Malignant neoplasm of prostate: Secondary | ICD-10-CM

## 2017-08-30 DIAGNOSIS — R972 Elevated prostate specific antigen [PSA]: Secondary | ICD-10-CM

## 2017-08-30 DIAGNOSIS — R3989 Other symptoms and signs involving the genitourinary system: Secondary | ICD-10-CM | POA: Diagnosis not present

## 2017-08-30 DIAGNOSIS — N401 Enlarged prostate with lower urinary tract symptoms: Secondary | ICD-10-CM | POA: Diagnosis not present

## 2017-08-30 LAB — PSA: PROSTATE SPECIFIC AG, SERUM: 9.3 ng/mL — AB (ref 0.0–4.0)

## 2017-09-26 ENCOUNTER — Encounter: Payer: Self-pay | Admitting: Urology

## 2017-09-26 NOTE — Progress Notes (Signed)
Certified letter send 09/27/2017.

## 2017-10-09 ENCOUNTER — Other Ambulatory Visit: Payer: Medicare HMO | Admitting: Urology

## 2017-10-23 ENCOUNTER — Ambulatory Visit: Payer: Medicare HMO | Admitting: Urology

## 2017-11-28 ENCOUNTER — Other Ambulatory Visit: Payer: Medicare HMO

## 2017-11-29 ENCOUNTER — Ambulatory Visit: Payer: Medicare HMO | Admitting: Urology

## 2017-12-26 ENCOUNTER — Other Ambulatory Visit: Payer: Self-pay | Admitting: Family Medicine

## 2017-12-26 ENCOUNTER — Other Ambulatory Visit: Payer: Medicare HMO

## 2017-12-26 DIAGNOSIS — C61 Malignant neoplasm of prostate: Secondary | ICD-10-CM | POA: Diagnosis not present

## 2017-12-26 NOTE — Addendum Note (Signed)
Addended by: Kyra Manges on: 12/26/2017 08:50 AM   Modules accepted: Orders

## 2017-12-26 NOTE — Progress Notes (Signed)
December 27, 2016  10:13 AM   Eugene Bennett [birth year] [id number]  Referring provider: Glendon Axe, MD [facility] [place], [place] [address]  Chief Complaint  Patient presents with  . Follow-up    3 month follow up    HPI: 72 yo WM with Gleason 3 + 3 = 6 prostate cancer being managed with active surveillance who presents today for a 3 month follow up with his wife, Eugene Bennett.      Prostate cancer 72 year old white male was recently seen to establish primary care. The PSA was checked and noted to be 7.48. No prior history PSA screening. No family history of prostate cancer. He has some occasional frequency and urgency, but he voids with a good stream. No gross hematuria or dysuria. No history of BPH but he does have issues with getting and maintaining an erection.  The patient underwent a prostate biopsy in April 2017 which was positive for of 12 cores for Gleason 3+3 = 6 prostate cancer. Volumes of the cores ranged from 27% to 85%.   In September 2017, the patient underwent a repeat biopsy. This showed only 1 core of Gleason 3+3 = 6 prostate cancer in the left lateral mid. It took up 22% of the core.  His current PSA is 10.3.     The pt today reports doing well overall without any symptoms. Pt needs 1 week to think about having a biopsy performed due to the perceived pain associated with the surgery. He denies no blood, no pain while urinating.    BPH WITH LUTS His IPSS score was 3 previously, which is mild lower urinary tract symptomatology.  He is delighted with his quality life due to his urinary symptoms.   His previous I PSS score was 3/0.  His major complaints were nocturia and incontinence.  He has had these symptoms for the last few years.  He denies any dysuria, hematuria or suprapubic pain.   He also denies any recent fevers, chills, nausea or vomiting.    PMH: Past Medical History:  Diagnosis Date  . Anemia   . Cancer ([place])    PROSTATE  .  Depression   . GERD (gastroesophageal reflux disease)   . H/O malignant neoplasm of skin 12/12/2012  . HOH (hard of hearing)    AIDS  . Stomach ulcer     Surgical History: Past Surgical History:  Procedure Laterality Date  . CATARACT EXTRACTION W/PHACO Left 05/19/2016   Procedure: CATARACT EXTRACTION PHACO AND INTRAOCULAR LENS PLACEMENT (IOC);  Surgeon: Eulogio Bear, MD;  Location: [facility] ORS;  Service: Ophthalmology;  Laterality: Left;  [place] 46.9 AP% 16.8 CDE 8.33 Fluid pack lot # [id number]  . CATARACT EXTRACTION W/PHACO Right 06/09/2016   Procedure: CATARACT EXTRACTION PHACO AND INTRAOCULAR LENS PLACEMENT (IOC);  Surgeon: Eulogio Bear, MD;  Location: [facility] ORS;  Service: Ophthalmology;  Laterality: Right;  [place] 54.8 AP% 13.6 CDE 4.73 Fluid Pack lot # [id number] H  . COLONOSCOPY WITH PROPOFOL N/A 06/08/2015   Procedure: COLONOSCOPY WITH PROPOFOL;  Surgeon: Manya Silvas, MD;  Location: [facility] ENDOSCOPY;  Service: Endoscopy;  Laterality: N/A;  . MANDIBLE SURGERY     TTETH EXTRACTIONS WITH METAL PLATE / LEFT SIDE  . PROSTATE BIOPSY    . skin cancer removal    . SKIN GRAFT  1980    Home Medications:  Allergies as of 12/27/2017   No Known Allergies     Medication List  Accurate as of 12/27/17 10:13 AM. Always use your most recent med list.          acetaminophen 500 MG tablet Commonly known as:  TYLENOL Take 1,000 mg by mouth 2 (two) times daily as needed for headache. PRN   benzonatate 100 MG capsule Commonly known as:  TESSALON Take 100 mg by mouth daily as needed for cough.   cyanocobalamin 1000 MCG/ML injection Commonly known as:  (VITAMIN B-12) INJECT 1 ML IN THE MUSCLE MONTHLY ON THE 8TH OF THE MONTH   escitalopram 10 MG tablet Commonly known as:  LEXAPRO Take 10 mg by mouth daily.   FOLIC ACID PO Take by mouth 3 (three) times daily.   ibuprofen 200 MG tablet Commonly known as:  ADVIL,MOTRIN Take 400 mg by mouth 2 (two) times daily as needed for  headache.   multivitamin with minerals Tabs tablet Take 1 tablet by mouth daily.   OVER THE COUNTER MEDICATION Take 2 tablets by mouth 2 (two) times daily as needed (allergies). Coricidin Allergy   pantoprazole 40 MG tablet Commonly known as:  PROTONIX TK 1 T PO  QD   traMADol 50 MG tablet Commonly known as:  ULTRAM TAKE 1 TABLET BY MOUTH DAILY AS NEEDED FOR PAIN   VANISHPOINT TUBERCULIN SYRINGE 25G X 5/8" 1 ML Misc Generic drug:  TUBERCULIN SYR 1CC/25GX5/8" U ONCE A MONTH WITH B12 INJECTIONS       Allergies: No Known Allergies  Family History: Family History  Problem Relation Age of Onset  . Stomach cancer Paternal Grandfather   . Kidney cancer Neg Hx   . Bladder Cancer Neg Hx   . Prostate cancer Neg Hx     Social History:  reports that he has been smoking cigarettes. He has been smoking about 2.00 packs per day. He has never used smokeless tobacco. He reports that he does not drink alcohol or use drugs.  ROS: UROLOGY Frequent Urination?: No Hard to postpone urination?: No Burning/pain with urination?: No Get up at night to urinate?: No Leakage of urine?: No Urine stream starts and stops?: No Trouble starting stream?: No Do you have to strain to urinate?: No Blood in urine?: No Urinary tract infection?: No Sexually transmitted disease?: No Injury to kidneys or bladder?: No Painful intercourse?: No Weak stream?: No Erection problems?: Yes Penile pain?: No  Gastrointestinal Nausea?: No Vomiting?: No Indigestion/heartburn?: No Diarrhea?: No Constipation?: No  Constitutional Fever: No Night sweats?: No Weight loss?: No Fatigue?: Yes  Skin Skin rash/lesions?: No Itching?: No  Eyes Blurred vision?: No Double vision?: No  Ears/Nose/Throat Sore throat?: No Sinus problems?: Yes  Hematologic/Lymphatic Swollen glands?: No Easy bruising?: Yes  Cardiovascular Leg swelling?: No Chest pain?: No  Respiratory Cough?: No Shortness of breath?:  No  Endocrine Excessive thirst?: No  Musculoskeletal Back pain?: No Joint pain?: Yes  Neurological Headaches?: No Dizziness?: No  Psychologic Depression?: Yes Anxiety?: No  Physical Exam: BP (!) 144/78 (BP Location: Left Arm, Patient Position: Sitting, Cuff Size: Normal)   Pulse 64   Ht 5' (1.524 m)   Wt 127 lb 14.4 oz (58 kg)   BMI 24.98 kg/m   Constitutional: Well nourished. Alert and oriented, No acute distress. HEENT: [place] AT, moist mucus membranes. Trachea midline, no masses. Cardiovascular: No clubbing, cyanosis, or edema. Respiratory: Normal respiratory effort, no increased work of breathing. GI: Abdomen is soft, non tender, non distended, no abdominal masses. Liver and spleen not palpable.  No hernias appreciated.  Stool sample for occult  testing is not indicated.   GU: No CVA tenderness.  No bladder fullness or masses.  Patient with uncircumcised phallus.Foreskin easily retracted  Urethral meatus is patent.  No penile discharge. No penile lesions or rashes. Scrotum without lesions, cysts, rashes and/or edema.  Testicles are located scrotally bilaterally. No masses are appreciated in the testicles. Left and right epididymis are normal. Rectal: Patient with  normal sphincter tone. Anus and perineum without scarring or rashes. No rectal masses are appreciated. Prostate is approximately 40 grams and nodule in left lobe about 5x5 mm in size, right lobe is firm.  Seminal vesicles are normal. Skin: No rashes, bruises or suspicious lesions. Lymph: No cervical or inguinal adenopathy. Neurologic: Grossly intact, no focal deficits, moving all 4 extremities. Psychiatric: Normal mood and affect.    Laboratory Data: Results for orders placed or performed in visit on 12/26/17  PSA  Result Value Ref Range   Prostate Specific Ag, Serum 10.3 (H) 0.0 - 4.0 ng/mL   PSA History  7.9 ng/mL on 04/08/2015  bx - Gleason's 6  On 05/18/2015  bx- Gleason's 6 on 10/22/2015  7.0 ng/mL on  05/24/2016  8.1 ng/mL on 11/23/2016  9.0 ng/mL on 05/22/2017  9.3 ng/mL on 08/29/2017  10.3 ng/mL on 12/26/17 I have reviewed the labs.  Assessment & Plan:   1. Prostate cancer (Gleason 3 + 3) History of low risk prostate cancer - likely progressed to high grade but need confirmatory biopsy Previously discussed with pt and wife that PSA has continued to increase - explained to the patient that this may represent a worsening pathology of the cancer and I strongly encouraged a biopsy at this time especially since the prostate exam has changed, he had a "bad experience" with his last biopsy and had to visit the ED -reviewing the ED notes - his UA was suspicious for infection, but urine culture and blood cultures were negative and WBC count was normal - he was treated Keflex po as an outpatient -Patient was previously scheduled for a TRUSPBx of prostate, but he cancelled  -Again discussed The procedure was explained and the risks involved, such as blood in urine, blood in stool, blood in semen, infection, urinary retention, and on rare occasions sepsis and death.  Patient understands the risks as explained to him.   -Again discussed concerns for not moving forward with the biopsy  -Pt will be called for consideration of prostate biopsy in 2 weeks   2. Rising PSA See above  3. Abnormal prostate exam See above   Zara Council, PA-C  [facility] [address] [place], [place] [address] [phone]  I, Lucas Mallow, am acting as a Education administrator for [employer],  I have reviewed the above documentation for accuracy and completeness, and I agree with the above.    Zara Council, PA-C

## 2017-12-27 ENCOUNTER — Encounter: Payer: Self-pay | Admitting: Urology

## 2017-12-27 ENCOUNTER — Telehealth: Payer: Self-pay | Admitting: Urology

## 2017-12-27 ENCOUNTER — Ambulatory Visit: Payer: Medicare HMO | Admitting: Urology

## 2017-12-27 VITALS — BP 144/78 | HR 64 | Ht 60.0 in | Wt 127.9 lb

## 2017-12-27 DIAGNOSIS — N401 Enlarged prostate with lower urinary tract symptoms: Secondary | ICD-10-CM | POA: Diagnosis not present

## 2017-12-27 DIAGNOSIS — N138 Other obstructive and reflux uropathy: Secondary | ICD-10-CM | POA: Diagnosis not present

## 2017-12-27 DIAGNOSIS — Z23 Encounter for immunization: Secondary | ICD-10-CM | POA: Diagnosis not present

## 2017-12-27 DIAGNOSIS — C61 Malignant neoplasm of prostate: Secondary | ICD-10-CM | POA: Diagnosis not present

## 2017-12-27 DIAGNOSIS — K2990 Gastroduodenitis, unspecified, without bleeding: Secondary | ICD-10-CM | POA: Diagnosis not present

## 2017-12-27 DIAGNOSIS — R972 Elevated prostate specific antigen [PSA]: Secondary | ICD-10-CM | POA: Diagnosis not present

## 2017-12-27 DIAGNOSIS — R3989 Other symptoms and signs involving the genitourinary system: Secondary | ICD-10-CM

## 2017-12-27 DIAGNOSIS — Z0000 Encounter for general adult medical examination without abnormal findings: Secondary | ICD-10-CM | POA: Diagnosis not present

## 2017-12-27 DIAGNOSIS — E538 Deficiency of other specified B group vitamins: Secondary | ICD-10-CM | POA: Diagnosis not present

## 2017-12-27 DIAGNOSIS — F325 Major depressive disorder, single episode, in full remission: Secondary | ICD-10-CM | POA: Diagnosis not present

## 2017-12-27 DIAGNOSIS — K2970 Gastritis, unspecified, without bleeding: Secondary | ICD-10-CM | POA: Diagnosis not present

## 2017-12-27 LAB — PSA: Prostate Specific Ag, Serum: 10.3 ng/mL — ABNORMAL HIGH (ref 0.0–4.0)

## 2017-12-27 NOTE — Telephone Encounter (Signed)
I spoke with Eugene Bennett, his wife, and explained that we could start with a MRI of the prostate prior to having a biopsy.  She will speak with him tonight and call back tomorrow and let us know if he would like to proceed with the MRI.  He has metal in his jaw, so we will need to speak to the MRI folks to see if he is okay to have a MRI.

## 2018-01-08 IMAGING — CT CT HEAD W/O CM
3 series · 15 of 45 positions shown, 18 images · non-contrast
Comparison: None.

CLINICAL DATA: Acute onset of headache and intermittent fever.
Initial encounter.

EXAM:
CT HEAD WITHOUT CONTRAST
TECHNIQUE: Contiguous axial images were obtained from the base of the skull
through the vertex without intravenous contrast.

[Series 2: head wo · axial · 0.39mm/px · z∈[-91,+24]mm · 9 of 28 slices shown, 12 images]
[im 3/28  brain]
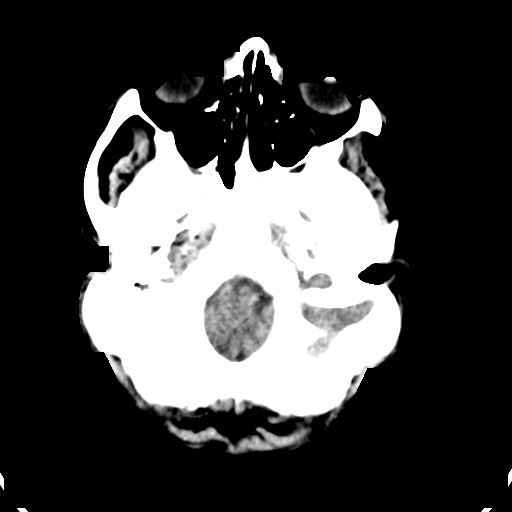
[im 3/28  bone]
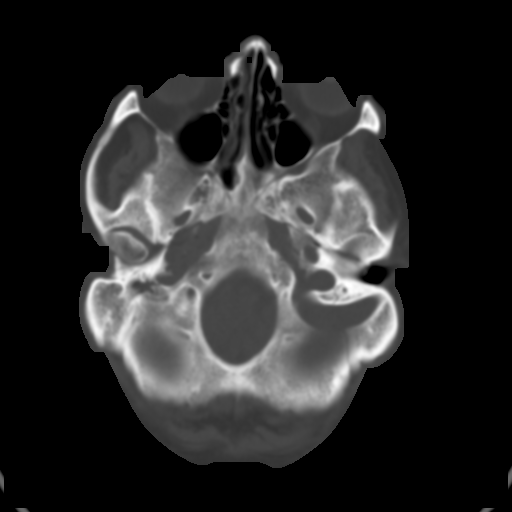
[im 6/28  brain]
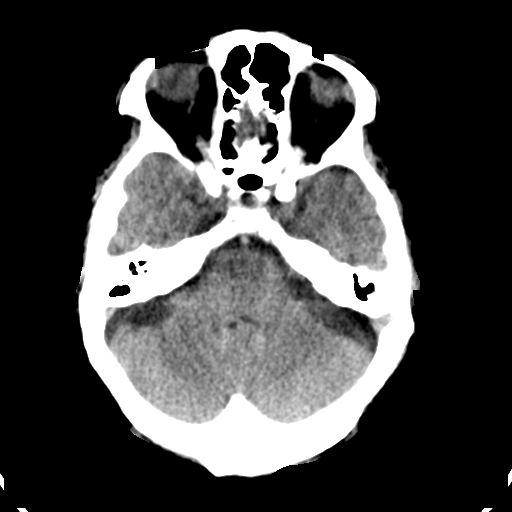
[im 9/28  brain]
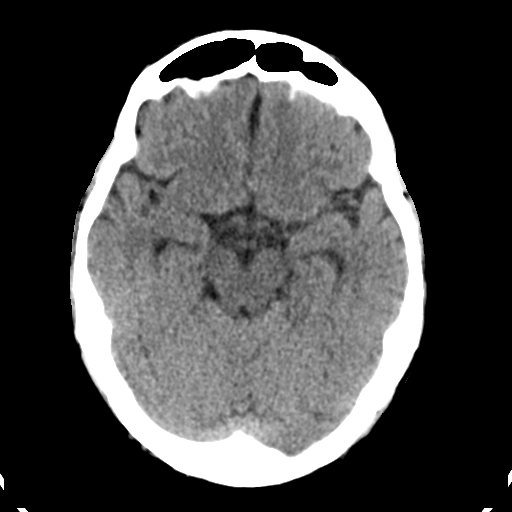
[im 12/28  brain]
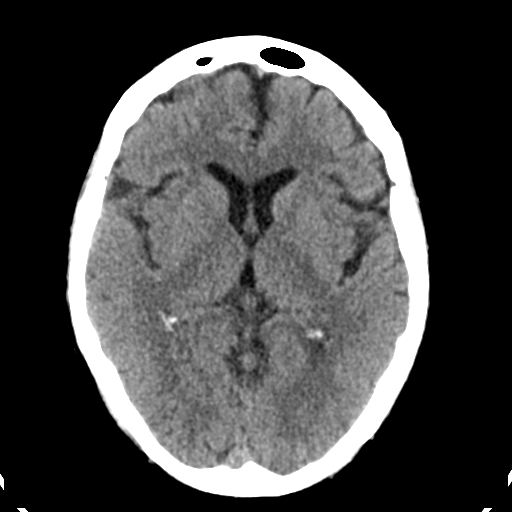
[im 15/28  brain]
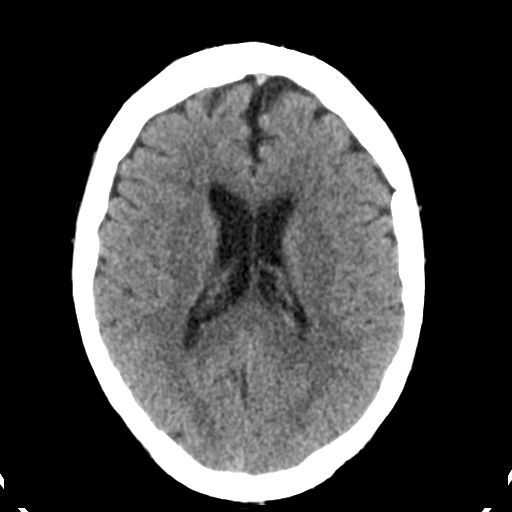
[im 15/28  bone]
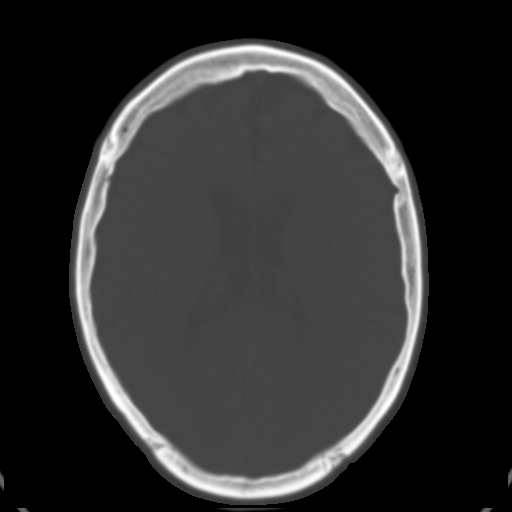
[im 17/28  brain]
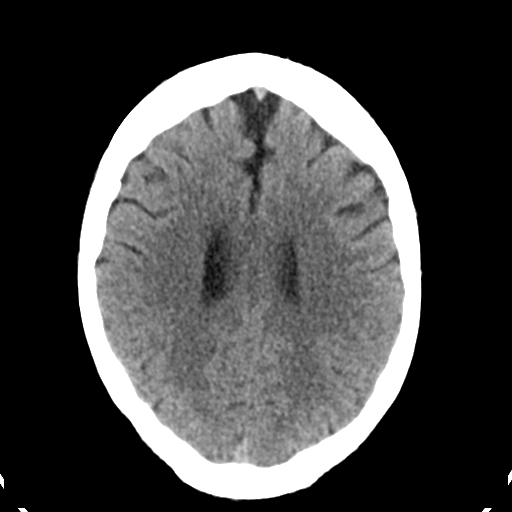
[im 20/28  brain]
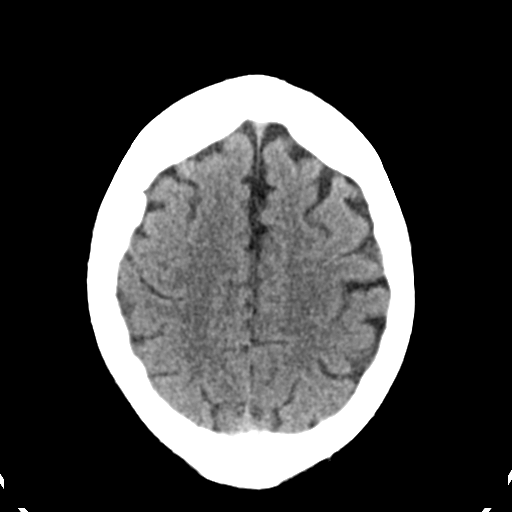
[im 23/28  brain]
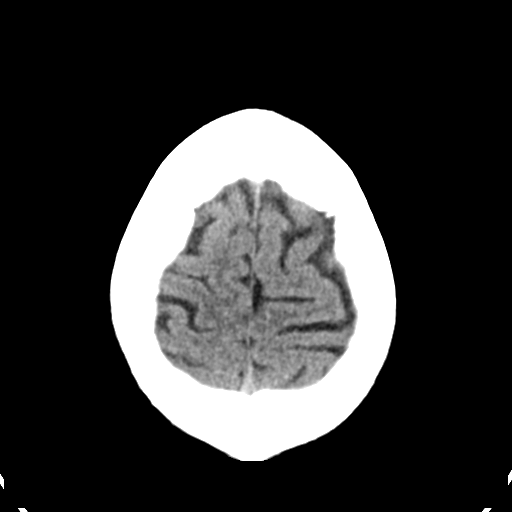
[im 26/28  brain]
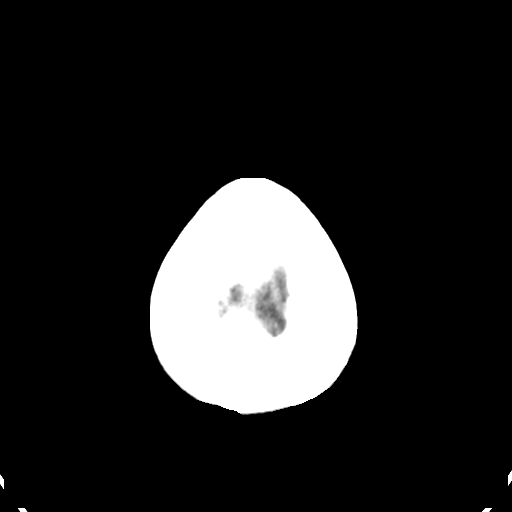
[im 26/28  bone]
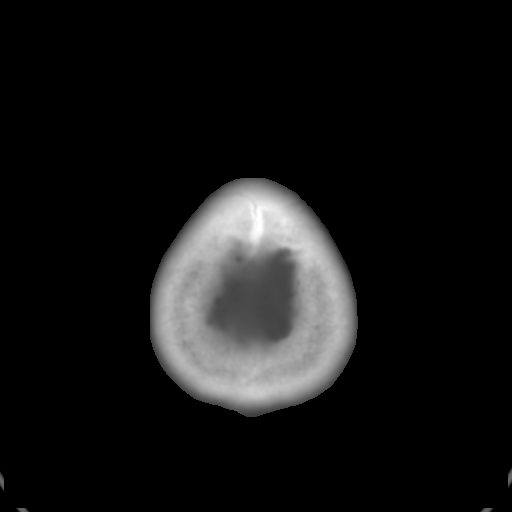

[Series 4: coronal soft tissue · coronal · 0.26mm/px · 3 of 67 slices shown]
[im 23/67  brain]
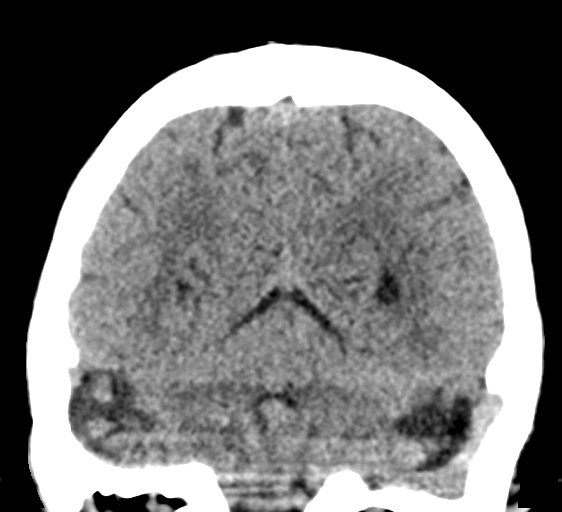
[im 30/67  brain]
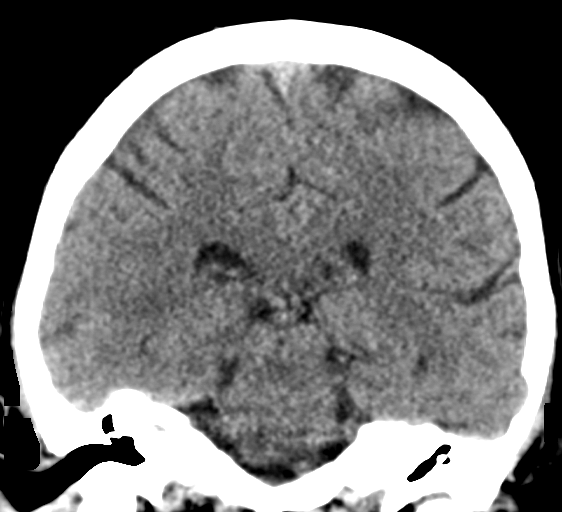
[im 37/67  brain]
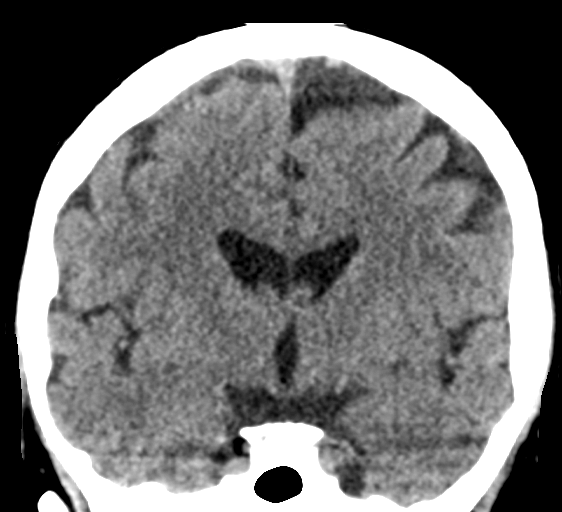

[Series 5: sagittal soft tissue · sagittal · 0.25mm/px · 3 of 49 slices shown]
[im 17/49  brain]
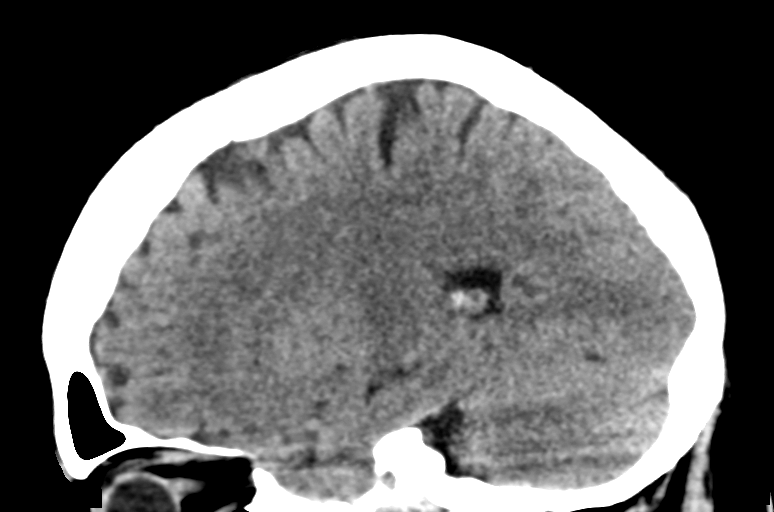
[im 25/49  brain]
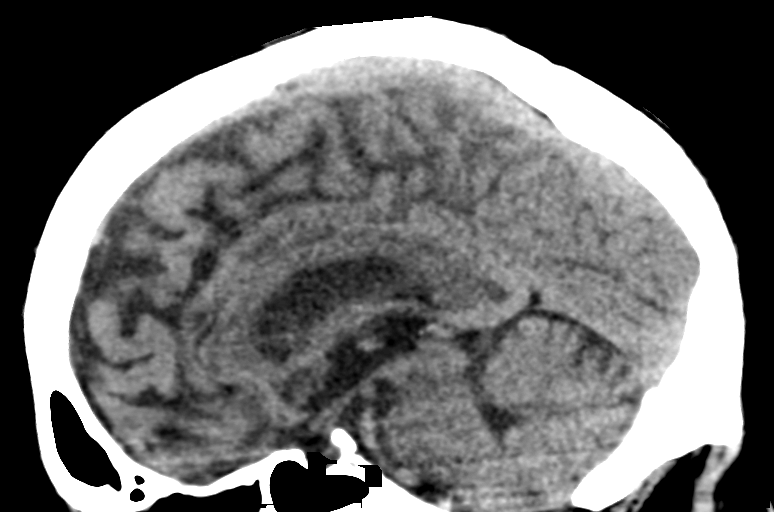
[im 33/49  brain]
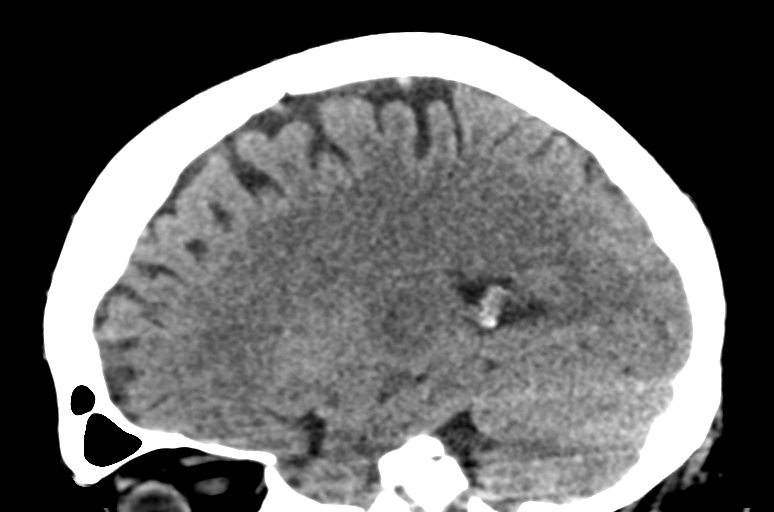

[15 of 45 positions shown; findings below may reference images not displayed]

FINDINGS: Brain: No evidence of acute infarction, hemorrhage, hydrocephalus,
extra-axial collection or mass lesion/mass effect.

The posterior fossa, including the cerebellum, brainstem and fourth
ventricle, is within normal limits. The third and lateral
ventricles, and basal ganglia are unremarkable in appearance. The
cerebral hemispheres are symmetric in appearance, with normal
gray-white differentiation. No mass effect or midline shift is seen.

Vascular: No hyperdense vessel or unexpected calcification.

Skull: There is no evidence of fracture; visualized osseous
structures are unremarkable in appearance.

Sinuses/Orbits: The orbits are within normal limits. The paranasal
sinuses and mastoid air cells are well-aerated.

Other: No significant soft tissue abnormalities are seen.
IMPRESSION: Unremarkable noncontrast CT of the head.

## 2018-01-21 ENCOUNTER — Telehealth: Payer: Self-pay | Admitting: Urology

## 2018-01-21 NOTE — Telephone Encounter (Signed)
Would you please ask Eugene Bennett if he has decided to go forward with the MRI of the prostate?

## 2018-01-22 NOTE — Telephone Encounter (Signed)
Left message for patient to call office to let us know if he wants to move forward with MRI of the prostate.

## 2018-02-22 ENCOUNTER — Telehealth: Payer: Self-pay | Admitting: Urology

## 2018-02-22 NOTE — Telephone Encounter (Signed)
I spoke with Eugene Bennett regarding going forward with the MRI of his prostate.  He stated he was still thinking about it, but he is really leaning towards keeping a check on it.  I expressed my concerns as his prostate exam has changed and his PSA is continuing to rise.  This may represent a change in the aggressiveness of his prostate cancer and that it may spread to other organs in his body.  If this should happen, it limits our options for treatments and preserving his quality of life.  He said he would call tomorrow with his decision.

## 2018-02-26 ENCOUNTER — Encounter: Payer: Self-pay | Admitting: Urology

## 2018-02-27 ENCOUNTER — Telehealth: Payer: Self-pay | Admitting: Urology

## 2018-02-27 NOTE — Telephone Encounter (Signed)
Certified letter sent 02/27/2018

## 2019-04-19 DIAGNOSIS — F17210 Nicotine dependence, cigarettes, uncomplicated: Secondary | ICD-10-CM | POA: Diagnosis not present

## 2019-04-19 DIAGNOSIS — E538 Deficiency of other specified B group vitamins: Secondary | ICD-10-CM | POA: Diagnosis not present

## 2019-04-19 DIAGNOSIS — F329 Major depressive disorder, single episode, unspecified: Secondary | ICD-10-CM | POA: Diagnosis not present

## 2019-04-19 DIAGNOSIS — Z125 Encounter for screening for malignant neoplasm of prostate: Secondary | ICD-10-CM | POA: Diagnosis not present

## 2019-04-19 DIAGNOSIS — R7303 Prediabetes: Secondary | ICD-10-CM | POA: Diagnosis not present

## 2019-04-19 DIAGNOSIS — Z0001 Encounter for general adult medical examination with abnormal findings: Secondary | ICD-10-CM | POA: Diagnosis not present

## 2019-04-19 DIAGNOSIS — Z8546 Personal history of malignant neoplasm of prostate: Secondary | ICD-10-CM | POA: Diagnosis not present

## 2019-08-08 ENCOUNTER — Telehealth: Payer: Self-pay | Admitting: Urology

## 2019-08-08 NOTE — Telephone Encounter (Signed)
I have spoken with his wife, [name], and made her aware of the PSA result from his PCP's office of 13.19 and that indicates a change in the pathology of his prostate cancer and it is now likely more aggressive and metastasized.  She will speak to her husband regarding this issue and she states she is very much aware of his situation.

## 2019-08-09 NOTE — Telephone Encounter (Signed)
Patients wife called, spoke to [place] and he still does not want to have any testing done at this point.

## 2019-10-28 DIAGNOSIS — F325 Major depressive disorder, single episode, in full remission: Secondary | ICD-10-CM | POA: Diagnosis not present

## 2019-10-28 DIAGNOSIS — R7303 Prediabetes: Secondary | ICD-10-CM | POA: Diagnosis not present

## 2019-10-28 DIAGNOSIS — R030 Elevated blood-pressure reading, without diagnosis of hypertension: Secondary | ICD-10-CM | POA: Diagnosis not present

## 2019-10-28 DIAGNOSIS — F17200 Nicotine dependence, unspecified, uncomplicated: Secondary | ICD-10-CM | POA: Diagnosis not present

## 2019-10-28 DIAGNOSIS — E538 Deficiency of other specified B group vitamins: Secondary | ICD-10-CM | POA: Diagnosis not present

## 2019-11-22 DIAGNOSIS — S61411A Laceration without foreign body of right hand, initial encounter: Secondary | ICD-10-CM | POA: Diagnosis not present

## 2019-11-22 DIAGNOSIS — Z23 Encounter for immunization: Secondary | ICD-10-CM | POA: Diagnosis not present

## 2023-02-18 DIAGNOSIS — R509 Fever, unspecified: Secondary | ICD-10-CM | POA: Diagnosis not present

## 2023-02-18 DIAGNOSIS — J189 Pneumonia, unspecified organism: Secondary | ICD-10-CM | POA: Diagnosis not present

## 2023-02-18 DIAGNOSIS — U071 COVID-19: Secondary | ICD-10-CM | POA: Diagnosis not present

## 2023-02-18 DIAGNOSIS — R051 Acute cough: Secondary | ICD-10-CM | POA: Diagnosis not present

## 2023-02-18 DIAGNOSIS — J9801 Acute bronchospasm: Secondary | ICD-10-CM | POA: Diagnosis not present

## 2023-02-18 DIAGNOSIS — J00 Acute nasopharyngitis [common cold]: Secondary | ICD-10-CM | POA: Diagnosis not present

## 2023-02-18 DIAGNOSIS — R948 Abnormal results of function studies of other organs and systems: Secondary | ICD-10-CM | POA: Diagnosis not present

## 2023-02-18 DIAGNOSIS — J209 Acute bronchitis, unspecified: Secondary | ICD-10-CM | POA: Diagnosis not present

## 2023-02-18 DIAGNOSIS — J101 Influenza due to other identified influenza virus with other respiratory manifestations: Secondary | ICD-10-CM | POA: Diagnosis not present

## 2023-04-12 DIAGNOSIS — H90A22 Sensorineural hearing loss, unilateral, left ear, with restricted hearing on the contralateral side: Secondary | ICD-10-CM | POA: Diagnosis not present
